# Patient Record
Sex: Female | Born: 1940 | Race: White | Hispanic: No | Marital: Single
Health system: Southern US, Community
[De-identification: ages and names within clinical notes are randomized; demographics above are authoritative.]

---

## 2004-08-09 ENCOUNTER — Ambulatory Visit: Payer: Self-pay | Admitting: Anesthesiology

## 2004-08-24 ENCOUNTER — Ambulatory Visit: Payer: Self-pay | Admitting: Anesthesiology

## 2004-09-21 ENCOUNTER — Ambulatory Visit: Payer: Self-pay | Admitting: Anesthesiology

## 2004-11-01 ENCOUNTER — Ambulatory Visit: Payer: Self-pay | Admitting: Anesthesiology

## 2004-11-02 ENCOUNTER — Ambulatory Visit: Payer: Self-pay | Admitting: Anesthesiology

## 2004-11-16 ENCOUNTER — Ambulatory Visit: Payer: Self-pay | Admitting: Anesthesiology

## 2004-12-28 ENCOUNTER — Ambulatory Visit: Payer: Self-pay | Admitting: Anesthesiology

## 2005-02-16 ENCOUNTER — Ambulatory Visit: Payer: Self-pay | Admitting: Anesthesiology

## 2005-04-05 ENCOUNTER — Ambulatory Visit: Payer: Self-pay | Admitting: Anesthesiology

## 2005-05-31 ENCOUNTER — Ambulatory Visit: Payer: Self-pay | Admitting: Anesthesiology

## 2005-07-19 ENCOUNTER — Ambulatory Visit: Payer: Self-pay | Admitting: Anesthesiology

## 2005-09-26 ENCOUNTER — Ambulatory Visit: Payer: Self-pay | Admitting: Anesthesiology

## 2005-10-24 ENCOUNTER — Ambulatory Visit: Payer: Self-pay | Admitting: Anesthesiology

## 2005-12-20 ENCOUNTER — Ambulatory Visit: Payer: Self-pay | Admitting: Anesthesiology

## 2006-02-05 ENCOUNTER — Other Ambulatory Visit: Payer: Self-pay

## 2006-02-05 ENCOUNTER — Emergency Department: Payer: Self-pay | Admitting: Emergency Medicine

## 2006-02-13 ENCOUNTER — Ambulatory Visit: Payer: Self-pay | Admitting: Anesthesiology

## 2006-04-23 ENCOUNTER — Other Ambulatory Visit: Payer: Self-pay

## 2006-04-23 ENCOUNTER — Emergency Department: Payer: Self-pay | Admitting: Emergency Medicine

## 2006-05-16 ENCOUNTER — Ambulatory Visit: Payer: Self-pay | Admitting: Anesthesiology

## 2006-05-25 ENCOUNTER — Ambulatory Visit: Payer: Self-pay | Admitting: Anesthesiology

## 2006-07-06 ENCOUNTER — Ambulatory Visit: Payer: Self-pay | Admitting: Anesthesiology

## 2006-08-24 ENCOUNTER — Ambulatory Visit: Payer: Self-pay | Admitting: Anesthesiology

## 2006-09-26 ENCOUNTER — Ambulatory Visit: Payer: Self-pay | Admitting: Anesthesiology

## 2006-11-09 ENCOUNTER — Ambulatory Visit: Payer: Self-pay | Admitting: Internal Medicine

## 2006-11-21 ENCOUNTER — Ambulatory Visit: Payer: Self-pay | Admitting: Internal Medicine

## 2006-11-22 ENCOUNTER — Ambulatory Visit: Payer: Self-pay | Admitting: Anesthesiology

## 2006-12-19 ENCOUNTER — Ambulatory Visit: Payer: Self-pay | Admitting: Anesthesiology

## 2007-01-17 ENCOUNTER — Ambulatory Visit: Payer: Self-pay | Admitting: Anesthesiology

## 2007-02-12 ENCOUNTER — Ambulatory Visit: Payer: Self-pay | Admitting: Otolaryngology

## 2007-03-21 ENCOUNTER — Ambulatory Visit: Payer: Self-pay | Admitting: Anesthesiology

## 2007-04-23 ENCOUNTER — Ambulatory Visit: Payer: Self-pay | Admitting: Anesthesiology

## 2007-05-27 ENCOUNTER — Ambulatory Visit: Payer: Self-pay | Admitting: Family Medicine

## 2007-06-14 ENCOUNTER — Ambulatory Visit: Payer: Self-pay | Admitting: Anesthesiology

## 2007-07-02 ENCOUNTER — Ambulatory Visit: Payer: Self-pay | Admitting: Family Medicine

## 2007-07-13 ENCOUNTER — Ambulatory Visit: Payer: Self-pay | Admitting: Family Medicine

## 2007-07-18 ENCOUNTER — Ambulatory Visit: Payer: Self-pay | Admitting: Anesthesiology

## 2007-09-17 ENCOUNTER — Ambulatory Visit: Payer: Self-pay | Admitting: Anesthesiology

## 2007-11-01 ENCOUNTER — Ambulatory Visit: Payer: Self-pay | Admitting: Anesthesiology

## 2007-12-10 ENCOUNTER — Ambulatory Visit: Payer: Self-pay | Admitting: Anesthesiology

## 2008-01-10 ENCOUNTER — Ambulatory Visit: Payer: Self-pay | Admitting: Anesthesiology

## 2008-02-25 ENCOUNTER — Ambulatory Visit: Payer: Self-pay | Admitting: Anesthesiology

## 2008-03-17 ENCOUNTER — Ambulatory Visit: Payer: Self-pay | Admitting: Anesthesiology

## 2008-04-28 ENCOUNTER — Ambulatory Visit: Payer: Self-pay | Admitting: Anesthesiology

## 2008-05-12 ENCOUNTER — Ambulatory Visit: Payer: Self-pay | Admitting: Anesthesiology

## 2008-05-14 ENCOUNTER — Emergency Department: Payer: Self-pay | Admitting: Emergency Medicine

## 2008-07-03 ENCOUNTER — Ambulatory Visit: Payer: Self-pay | Admitting: Anesthesiology

## 2008-07-05 ENCOUNTER — Inpatient Hospital Stay: Payer: Self-pay | Admitting: Internal Medicine

## 2008-07-14 ENCOUNTER — Inpatient Hospital Stay: Payer: Self-pay | Admitting: Internal Medicine

## 2008-08-04 ENCOUNTER — Ambulatory Visit: Payer: Self-pay | Admitting: Cardiovascular Disease

## 2008-08-18 ENCOUNTER — Ambulatory Visit: Payer: Self-pay | Admitting: Anesthesiology

## 2008-09-24 ENCOUNTER — Ambulatory Visit: Payer: Self-pay | Admitting: Anesthesiology

## 2008-10-23 ENCOUNTER — Ambulatory Visit: Payer: Self-pay | Admitting: Anesthesiology

## 2008-11-11 ENCOUNTER — Ambulatory Visit: Payer: Self-pay | Admitting: Anesthesiology

## 2008-12-02 ENCOUNTER — Ambulatory Visit: Payer: Self-pay | Admitting: Anesthesiology

## 2008-12-31 ENCOUNTER — Ambulatory Visit: Payer: Self-pay | Admitting: Anesthesiology

## 2009-02-16 ENCOUNTER — Ambulatory Visit: Payer: Self-pay | Admitting: Anesthesiology

## 2009-03-27 ENCOUNTER — Ambulatory Visit: Payer: Self-pay | Admitting: Anesthesiology

## 2009-06-15 ENCOUNTER — Ambulatory Visit: Payer: Self-pay | Admitting: Anesthesiology

## 2009-06-25 ENCOUNTER — Ambulatory Visit: Payer: Self-pay | Admitting: Anesthesiology

## 2009-07-28 ENCOUNTER — Ambulatory Visit: Payer: Self-pay | Admitting: Anesthesiology

## 2009-11-11 ENCOUNTER — Ambulatory Visit: Payer: Self-pay | Admitting: Anesthesiology

## 2009-12-17 ENCOUNTER — Ambulatory Visit: Payer: Self-pay | Admitting: Anesthesiology

## 2010-02-10 ENCOUNTER — Ambulatory Visit: Payer: Self-pay | Admitting: Anesthesiology

## 2010-05-14 ENCOUNTER — Inpatient Hospital Stay: Payer: Self-pay | Admitting: Internal Medicine

## 2010-08-03 ENCOUNTER — Ambulatory Visit: Payer: Self-pay | Admitting: Anesthesiology

## 2010-10-14 ENCOUNTER — Inpatient Hospital Stay: Payer: Self-pay | Admitting: Internal Medicine

## 2010-11-26 ENCOUNTER — Ambulatory Visit: Payer: Self-pay | Admitting: Anesthesiology

## 2010-12-09 ENCOUNTER — Emergency Department: Payer: Self-pay | Admitting: Emergency Medicine

## 2010-12-10 ENCOUNTER — Emergency Department: Payer: Self-pay | Admitting: Emergency Medicine

## 2011-02-20 ENCOUNTER — Inpatient Hospital Stay: Payer: Self-pay | Admitting: *Deleted

## 2011-02-25 ENCOUNTER — Emergency Department: Payer: Self-pay | Admitting: Emergency Medicine

## 2011-03-13 ENCOUNTER — Inpatient Hospital Stay: Payer: Self-pay | Admitting: Internal Medicine

## 2011-04-09 ENCOUNTER — Ambulatory Visit: Payer: Self-pay | Admitting: Internal Medicine

## 2011-04-18 ENCOUNTER — Inpatient Hospital Stay: Payer: Self-pay | Admitting: Internal Medicine

## 2011-05-05 ENCOUNTER — Emergency Department: Payer: Self-pay | Admitting: Unknown Physician Specialty

## 2011-05-10 ENCOUNTER — Ambulatory Visit: Payer: Self-pay | Admitting: Internal Medicine

## 2011-05-19 ENCOUNTER — Inpatient Hospital Stay: Payer: Self-pay | Admitting: Internal Medicine

## 2011-05-19 LAB — CBC WITH DIFFERENTIAL/PLATELET
Basophil #: 0 10*3/uL (ref 0.0–0.1)
HGB: 10.2 g/dL — ABNORMAL LOW (ref 12.0–16.0)
Lymphocyte #: 2.4 10*3/uL (ref 1.0–3.6)
MCH: 32.1 pg (ref 26.0–34.0)
MCV: 96 fL (ref 80–100)
Monocyte %: 7.3 %
Neutrophil %: 64.7 %
Platelet: 279 10*3/uL (ref 150–440)
RDW: 14.8 % — ABNORMAL HIGH (ref 11.5–14.5)
WBC: 8.7 10*3/uL (ref 3.6–11.0)

## 2011-05-19 LAB — COMPREHENSIVE METABOLIC PANEL
Albumin: 2.9 g/dL — ABNORMAL LOW (ref 3.4–5.0)
Alkaline Phosphatase: 65 U/L (ref 50–136)
Bilirubin,Total: 0.8 mg/dL (ref 0.2–1.0)
Calcium, Total: 8.8 mg/dL (ref 8.5–10.1)
Chloride: 99 mmol/L (ref 98–107)
Creatinine: 0.61 mg/dL (ref 0.60–1.30)
EGFR (Non-African Amer.): >60
Glucose: 87 mg/dL (ref 65–99)
Osmolality: 275 (ref 275–301)
SGPT (ALT): 43 U/L
Sodium: 139 mmol/L (ref 136–145)
Total Protein: 6.3 g/dL — ABNORMAL LOW (ref 6.4–8.2)

## 2011-05-19 LAB — RAPID INFLUENZA A&B ANTIGENS

## 2011-05-19 LAB — URINALYSIS, COMPLETE
Bacteria: NONE SEEN
Bilirubin,UR: NEGATIVE
Blood: NEGATIVE
Leukocyte Esterase: NEGATIVE
Nitrite: NEGATIVE
Protein: 30
Specific Gravity: 1.012 (ref 1.003–1.030)

## 2011-05-19 LAB — PROTIME-INR: Prothrombin Time: 12.9 secs (ref 11.5–14.7)

## 2011-05-19 LAB — CK TOTAL AND CKMB (NOT AT ARMC)
CK, Total: 147 U/L (ref 21–215)
CK-MB: 0.6 ng/mL (ref 0.5–3.6)

## 2011-05-20 LAB — CBC WITH DIFFERENTIAL/PLATELET
Basophil %: 0.2 %
Eosinophil #: 0 10*3/uL (ref 0.0–0.7)
HCT: 31.9 % — ABNORMAL LOW (ref 35.0–47.0)
HGB: 10.8 g/dL — ABNORMAL LOW (ref 12.0–16.0)
Lymphocyte #: 0.7 10*3/uL — ABNORMAL LOW (ref 1.0–3.6)
MCH: 32.2 pg (ref 26.0–34.0)
MCHC: 33.8 g/dL (ref 32.0–36.0)
Monocyte #: 0.2 10*3/uL (ref 0.0–0.7)
Neutrophil #: 9.3 10*3/uL — ABNORMAL HIGH (ref 1.4–6.5)
Neutrophil %: 90.9 %
RDW: 14.5 % (ref 11.5–14.5)

## 2011-05-20 LAB — BASIC METABOLIC PANEL
Anion Gap: 11 (ref 7–16)
BUN: 14 mg/dL (ref 7–18)
Chloride: 102 mmol/L (ref 98–107)
Co2: 29 mmol/L (ref 21–32)
Creatinine: 0.69 mg/dL (ref 0.60–1.30)
Osmolality: 287 (ref 275–301)
Potassium: 4.1 mmol/L (ref 3.5–5.1)
Sodium: 142 mmol/L (ref 136–145)

## 2011-05-21 LAB — URINE CULTURE

## 2011-05-25 LAB — EXPECTORATED SPUTUM ASSESSMENT W REFEX TO RESP CULTURE

## 2011-05-25 LAB — CULTURE, BLOOD (SINGLE)

## 2011-06-10 ENCOUNTER — Ambulatory Visit: Payer: Self-pay | Admitting: Internal Medicine

## 2011-06-23 LAB — URINALYSIS, COMPLETE
Glucose,UR: NEGATIVE mg/dL (ref 0–75)
Hyaline Cast: 1
Leukocyte Esterase: NEGATIVE
Nitrite: NEGATIVE
Protein: 30
RBC,UR: <1 /HPF (ref 0–5)
Specific Gravity: 1.012 (ref 1.003–1.030)
WBC UR: 1 /HPF (ref 0–5)

## 2011-06-23 LAB — COMPREHENSIVE METABOLIC PANEL
Alkaline Phosphatase: 64 U/L (ref 50–136)
Anion Gap: 13 (ref 7–16)
BUN: 10 mg/dL (ref 7–18)
Bilirubin,Total: 0.7 mg/dL (ref 0.2–1.0)
Chloride: 102 mmol/L (ref 98–107)
Creatinine: 0.92 mg/dL (ref 0.60–1.30)
EGFR (African American): >60
Potassium: 4.5 mmol/L (ref 3.5–5.1)
SGPT (ALT): 85 U/L — ABNORMAL HIGH
Sodium: 140 mmol/L (ref 136–145)
Total Protein: 6.8 g/dL (ref 6.4–8.2)

## 2011-06-23 LAB — DRUG SCREEN, URINE
Barbiturates, Ur Screen: NEGATIVE (ref ?–200)
Cannabinoid 50 Ng, Ur ~~LOC~~: NEGATIVE (ref ?–50)
Cocaine Metabolite,Ur ~~LOC~~: NEGATIVE (ref ?–300)
MDMA (Ecstasy)Ur Screen: POSITIVE (ref ?–500)
Methadone, Ur Screen: NEGATIVE (ref ?–300)
Opiate, Ur Screen: POSITIVE (ref ?–300)
Phencyclidine (PCP) Ur S: NEGATIVE (ref ?–25)

## 2011-06-23 LAB — CBC
HCT: 32.6 % — ABNORMAL LOW (ref 35.0–47.0)
HGB: 10.9 g/dL — ABNORMAL LOW (ref 12.0–16.0)
MCHC: 33.6 g/dL (ref 32.0–36.0)
MCV: 94 fL (ref 80–100)
RBC: 3.48 10*6/uL — ABNORMAL LOW (ref 3.80–5.20)
WBC: 9.9 10*3/uL (ref 3.6–11.0)

## 2011-06-23 LAB — ETHANOL
Ethanol %: <0.003 % (ref 0.000–0.080)
Ethanol: <3 mg/dL

## 2011-06-23 LAB — TROPONIN I: Troponin-I: <0.02 ng/mL

## 2011-06-24 LAB — FOLATE: Folic Acid: 11.2 ng/mL (ref 3.1–100.0)

## 2011-06-25 ENCOUNTER — Inpatient Hospital Stay: Payer: Self-pay | Admitting: Internal Medicine

## 2011-06-25 LAB — BASIC METABOLIC PANEL
Anion Gap: 11 (ref 7–16)
Calcium, Total: 8.2 mg/dL — ABNORMAL LOW (ref 8.5–10.1)
Co2: 29 mmol/L (ref 21–32)
EGFR (African American): >60
EGFR (Non-African Amer.): >60
Glucose: 129 mg/dL — ABNORMAL HIGH (ref 65–99)
Osmolality: 283 (ref 275–301)

## 2011-06-25 LAB — CBC WITH DIFFERENTIAL/PLATELET
Basophil %: 0.2 %
Eosinophil %: 0.3 %
HGB: 9.5 g/dL — ABNORMAL LOW (ref 12.0–16.0)
Lymphocyte #: 2.3 10*3/uL (ref 1.0–3.6)
MCH: 31.2 pg (ref 26.0–34.0)
MCV: 94 fL (ref 80–100)
Monocyte %: 6.2 %
Neutrophil #: 5 10*3/uL (ref 1.4–6.5)
Platelet: 183 10*3/uL (ref 150–440)
RBC: 3.05 10*6/uL — ABNORMAL LOW (ref 3.80–5.20)
RDW: 16.9 % — ABNORMAL HIGH (ref 11.5–14.5)
WBC: 7.8 10*3/uL (ref 3.6–11.0)

## 2011-07-04 LAB — BASIC METABOLIC PANEL
Anion Gap: 14 (ref 7–16)
BUN: 11 mg/dL (ref 7–18)
Chloride: 96 mmol/L — ABNORMAL LOW (ref 98–107)
EGFR (African American): >60
EGFR (Non-African Amer.): >60
Osmolality: 276 (ref 275–301)
Sodium: 139 mmol/L (ref 136–145)

## 2011-07-04 LAB — CBC WITH DIFFERENTIAL/PLATELET
Basophil %: 0.2 %
Eosinophil #: 0 10*3/uL (ref 0.0–0.7)
HCT: 31.8 % — ABNORMAL LOW (ref 35.0–47.0)
Lymphocyte #: 2.4 10*3/uL (ref 1.0–3.6)
Monocyte #: 0.6 10*3/uL (ref 0.0–0.7)
Neutrophil #: 5.5 10*3/uL (ref 1.4–6.5)
Neutrophil %: 63.7 %
Platelet: 396 10*3/uL (ref 150–440)
RBC: 3.4 10*6/uL — ABNORMAL LOW (ref 3.80–5.20)
RDW: 17.1 % — ABNORMAL HIGH (ref 11.5–14.5)
WBC: 8.6 10*3/uL (ref 3.6–11.0)

## 2011-07-04 LAB — CK TOTAL AND CKMB (NOT AT ARMC)
CK, Total: 75 U/L (ref 21–215)
CK-MB: 0.9 ng/mL (ref 0.5–3.6)

## 2011-07-05 ENCOUNTER — Inpatient Hospital Stay: Payer: Self-pay | Admitting: Internal Medicine

## 2011-07-05 LAB — URINALYSIS, COMPLETE
Bacteria: NONE SEEN
Blood: NEGATIVE
Granular Cast: 1
Hyaline Cast: 3
Leukocyte Esterase: NEGATIVE
Ph: 6 (ref 4.5–8.0)
Squamous Epithelial: <1
WBC UR: 2 /HPF (ref 0–5)
Waxy Cast: 1

## 2011-07-05 LAB — GASTROCCULT (ARMC)
Occult Blood, Gastric: NEGATIVE
Ph, Gastric: 3 (ref 1–3)

## 2011-07-06 DIAGNOSIS — R000 Tachycardia, unspecified: Secondary | ICD-10-CM

## 2011-07-06 LAB — BASIC METABOLIC PANEL
Anion Gap: 9 (ref 7–16)
Calcium, Total: 8.3 mg/dL — ABNORMAL LOW (ref 8.5–10.1)
Chloride: 92 mmol/L — ABNORMAL LOW (ref 98–107)
Co2: 36 mmol/L — ABNORMAL HIGH (ref 21–32)
Creatinine: 1.36 mg/dL — ABNORMAL HIGH (ref 0.60–1.30)
EGFR (African American): 50 — ABNORMAL LOW
EGFR (Non-African Amer.): 41 — ABNORMAL LOW
Glucose: 140 mg/dL — ABNORMAL HIGH (ref 65–99)

## 2011-07-06 LAB — CK TOTAL AND CKMB (NOT AT ARMC)
CK, Total: 40 U/L (ref 21–215)
CK-MB: <0.5 ng/mL — ABNORMAL LOW (ref 0.5–3.6)

## 2011-07-07 LAB — CBC WITH DIFFERENTIAL/PLATELET
Basophil #: 0 10*3/uL (ref 0.0–0.1)
Basophil %: 0.1 %
Eosinophil #: 0 10*3/uL (ref 0.0–0.7)
Lymphocyte %: 7 %
MCHC: 32.7 g/dL (ref 32.0–36.0)
Monocyte %: 4.8 %
Neutrophil %: 88.1 %
Platelet: 336 10*3/uL (ref 150–440)
RDW: 16.5 % — ABNORMAL HIGH (ref 11.5–14.5)
WBC: 7.5 10*3/uL (ref 3.6–11.0)

## 2011-07-07 LAB — CREATININE, SERUM
Creatinine: 1.57 mg/dL — ABNORMAL HIGH (ref 0.60–1.30)
EGFR (African American): 42 — ABNORMAL LOW

## 2011-07-07 LAB — BASIC METABOLIC PANEL
Anion Gap: 12 (ref 7–16)
BUN: 16 mg/dL (ref 7–18)
Calcium, Total: 8.1 mg/dL — ABNORMAL LOW (ref 8.5–10.1)
EGFR (African American): 43 — ABNORMAL LOW
EGFR (Non-African Amer.): 35 — ABNORMAL LOW
Glucose: 148 mg/dL — ABNORMAL HIGH (ref 65–99)
Osmolality: 287 (ref 275–301)

## 2011-07-07 LAB — RETICULOCYTES
Absolute Retic Count: 0.101 10*6/uL — ABNORMAL HIGH (ref 0.024–0.084)
Reticulocyte: 3.7 % — ABNORMAL HIGH (ref 0.5–1.5)

## 2011-07-07 LAB — URINE CULTURE

## 2011-07-07 LAB — FERRITIN: Ferritin (ARMC): 377 ng/mL (ref 8–388)

## 2011-07-07 LAB — IRON AND TIBC
Iron Bind.Cap.(Total): 150 ug/dL — ABNORMAL LOW (ref 250–450)
Iron: 53 ug/dL (ref 50–170)
Unbound Iron-Bind.Cap.: 97 ug/dL

## 2011-07-07 LAB — HEMOGLOBIN: HGB: 8.5 g/dL — ABNORMAL LOW (ref 12.0–16.0)

## 2011-07-08 ENCOUNTER — Ambulatory Visit: Payer: Self-pay | Admitting: Internal Medicine

## 2011-07-08 LAB — CBC WITH DIFFERENTIAL/PLATELET
Basophil %: 0.2 %
Eosinophil %: 0 %
HCT: 25.1 % — ABNORMAL LOW (ref 35.0–47.0)
HGB: 8.2 g/dL — ABNORMAL LOW (ref 12.0–16.0)
Lymphocyte %: 9.2 %
MCH: 30.2 pg (ref 26.0–34.0)
MCHC: 32.7 g/dL (ref 32.0–36.0)
MCV: 93 fL (ref 80–100)
Monocyte #: 0.3 10*3/uL (ref 0.0–0.7)
Monocyte %: 3.9 %
Neutrophil #: 6.6 10*3/uL — ABNORMAL HIGH (ref 1.4–6.5)
Neutrophil %: 86.7 %
Platelet: 300 10*3/uL (ref 150–440)
WBC: 7.7 10*3/uL (ref 3.6–11.0)

## 2011-07-08 LAB — BASIC METABOLIC PANEL
Chloride: 99 mmol/L (ref 98–107)
Co2: 32 mmol/L (ref 21–32)
EGFR (African American): 43 — ABNORMAL LOW
Glucose: 117 mg/dL — ABNORMAL HIGH (ref 65–99)
Osmolality: 279 (ref 275–301)
Sodium: 138 mmol/L (ref 136–145)

## 2011-07-09 LAB — BASIC METABOLIC PANEL
Anion Gap: 10 (ref 7–16)
BUN: 22 mg/dL — ABNORMAL HIGH (ref 7–18)
Chloride: 99 mmol/L (ref 98–107)
Co2: 32 mmol/L (ref 21–32)
Creatinine: 1.52 mg/dL — ABNORMAL HIGH (ref 0.60–1.30)
EGFR (African American): 44 — ABNORMAL LOW
Osmolality: 285 (ref 275–301)
Sodium: 141 mmol/L (ref 136–145)

## 2011-07-09 LAB — HEMOGLOBIN: HGB: 8.5 g/dL — ABNORMAL LOW (ref 12.0–16.0)

## 2011-07-10 LAB — CULTURE, BLOOD (SINGLE)

## 2011-07-11 LAB — CBC WITH DIFFERENTIAL/PLATELET
Basophil #: 0 10*3/uL (ref 0.0–0.1)
Eosinophil %: 0 %
Lymphocyte #: 1.1 10*3/uL (ref 1.0–3.6)
MCH: 30.5 pg (ref 26.0–34.0)
MCV: 93 fL (ref 80–100)
Monocyte #: 0.5 10*3/uL (ref 0.0–0.7)
Monocyte %: 5.2 %
Neutrophil %: 83.7 %
Platelet: 306 10*3/uL (ref 150–440)
RDW: 16.3 % — ABNORMAL HIGH (ref 11.5–14.5)
WBC: 9.7 10*3/uL (ref 3.6–11.0)

## 2011-07-11 LAB — RENAL FUNCTION PANEL
BUN: 21 mg/dL — ABNORMAL HIGH (ref 7–18)
Chloride: 102 mmol/L (ref 98–107)
Creatinine: 1.54 mg/dL — ABNORMAL HIGH (ref 0.60–1.30)
EGFR (African American): 43 — ABNORMAL LOW
EGFR (Non-African Amer.): 36 — ABNORMAL LOW
Glucose: 137 mg/dL — ABNORMAL HIGH (ref 65–99)
Phosphorus: 3 mg/dL (ref 2.5–4.9)
Potassium: 4 mmol/L (ref 3.5–5.1)

## 2011-07-12 LAB — WBCS, STOOL

## 2011-07-12 LAB — PROTEIN ELECTROPHORESIS(ARMC)

## 2011-07-13 LAB — BASIC METABOLIC PANEL
Anion Gap: 9 (ref 7–16)
Calcium, Total: 8.3 mg/dL — ABNORMAL LOW (ref 8.5–10.1)
Co2: 30 mmol/L (ref 21–32)
Creatinine: 1.48 mg/dL — ABNORMAL HIGH (ref 0.60–1.30)
EGFR (African American): 45 — ABNORMAL LOW
EGFR (Non-African Amer.): 37 — ABNORMAL LOW
Glucose: 104 mg/dL — ABNORMAL HIGH (ref 65–99)
Osmolality: 283 (ref 275–301)
Sodium: 141 mmol/L (ref 136–145)

## 2011-08-19 ENCOUNTER — Emergency Department: Payer: Self-pay | Admitting: Emergency Medicine

## 2011-08-19 LAB — COMPREHENSIVE METABOLIC PANEL
Albumin: 3.2 g/dL — ABNORMAL LOW (ref 3.4–5.0)
Anion Gap: 8 (ref 7–16)
BUN: 15 mg/dL (ref 7–18)
Calcium, Total: 8.7 mg/dL (ref 8.5–10.1)
Chloride: 102 mmol/L (ref 98–107)
Co2: 30 mmol/L (ref 21–32)
Creatinine: 1.01 mg/dL (ref 0.60–1.30)
EGFR (Non-African Amer.): 57 — ABNORMAL LOW
SGOT(AST): 23 U/L (ref 15–37)
Sodium: 140 mmol/L (ref 136–145)
Total Protein: 5.9 g/dL — ABNORMAL LOW (ref 6.4–8.2)

## 2011-08-19 LAB — CBC
HCT: 26.9 % — ABNORMAL LOW (ref 35.0–47.0)
Platelet: 189 10*3/uL (ref 150–440)
RBC: 2.84 10*6/uL — ABNORMAL LOW (ref 3.80–5.20)
RDW: 16.9 % — ABNORMAL HIGH (ref 11.5–14.5)
WBC: 9.2 10*3/uL (ref 3.6–11.0)

## 2011-08-19 LAB — URINALYSIS, COMPLETE
Bacteria: NONE SEEN
Blood: NEGATIVE
Glucose,UR: NEGATIVE mg/dL (ref 0–75)
Ketone: NEGATIVE
Leukocyte Esterase: NEGATIVE
Nitrite: NEGATIVE
Ph: 6 (ref 4.5–8.0)
Squamous Epithelial: <1
WBC UR: 1 /HPF (ref 0–5)

## 2011-08-21 LAB — URINE CULTURE

## 2011-08-25 LAB — CULTURE, BLOOD (SINGLE)

## 2011-10-29 ENCOUNTER — Emergency Department: Payer: Self-pay | Admitting: Emergency Medicine

## 2011-10-29 LAB — BASIC METABOLIC PANEL
BUN: 16 mg/dL (ref 7–18)
Calcium, Total: 9.2 mg/dL (ref 8.5–10.1)
Chloride: 106 mmol/L (ref 98–107)
Co2: 29 mmol/L (ref 21–32)
Creatinine: 1.01 mg/dL (ref 0.60–1.30)
EGFR (African American): >60
EGFR (Non-African Amer.): 57 — ABNORMAL LOW
Glucose: 102 mg/dL — ABNORMAL HIGH (ref 65–99)
Osmolality: 285 (ref 275–301)
Potassium: 4.3 mmol/L (ref 3.5–5.1)

## 2011-10-29 LAB — URINALYSIS, COMPLETE
Blood: NEGATIVE
Ketone: NEGATIVE
Leukocyte Esterase: NEGATIVE
Nitrite: NEGATIVE
Ph: 5 (ref 4.5–8.0)
RBC,UR: NONE SEEN /HPF (ref 0–5)
Specific Gravity: 1.011 (ref 1.003–1.030)

## 2011-10-29 LAB — CK TOTAL AND CKMB (NOT AT ARMC): CK-MB: 0.7 ng/mL (ref 0.5–3.6)

## 2011-10-29 LAB — CBC
HCT: 32.7 % — ABNORMAL LOW (ref 35.0–47.0)
MCHC: 32.4 g/dL (ref 32.0–36.0)
MCV: 92 fL (ref 80–100)

## 2011-10-29 LAB — TROPONIN I: Troponin-I: <0.02 ng/mL

## 2011-10-29 LAB — PRO B NATRIURETIC PEPTIDE: B-Type Natriuretic Peptide: 91 pg/mL (ref 0–125)

## 2012-08-18 ENCOUNTER — Emergency Department: Payer: Self-pay | Admitting: Emergency Medicine

## 2012-09-29 ENCOUNTER — Emergency Department: Payer: Self-pay | Admitting: Emergency Medicine

## 2013-01-30 IMAGING — CR DG CHEST 2V
1 series · 3 of 3 positions shown · non-contrast
Comparison: none

REASON FOR EXAM: shortness of breath and fever
COMMENTS:

PROCEDURE:     DXR - DXR CHEST PA (OR AP) AND LATERAL  - May 19, 2011  [DATE]
RESULT:     Comparison is made to the study 05 May, 2011.
The lungs are hyperinflated but appear to be clear. The heart and pulmonary
vessels are normal. The bony and mediastinal structures are unremarkable.

[Series 1: ap · 0.17mm/px · 3 of 3 slices shown]
[im 1/3]
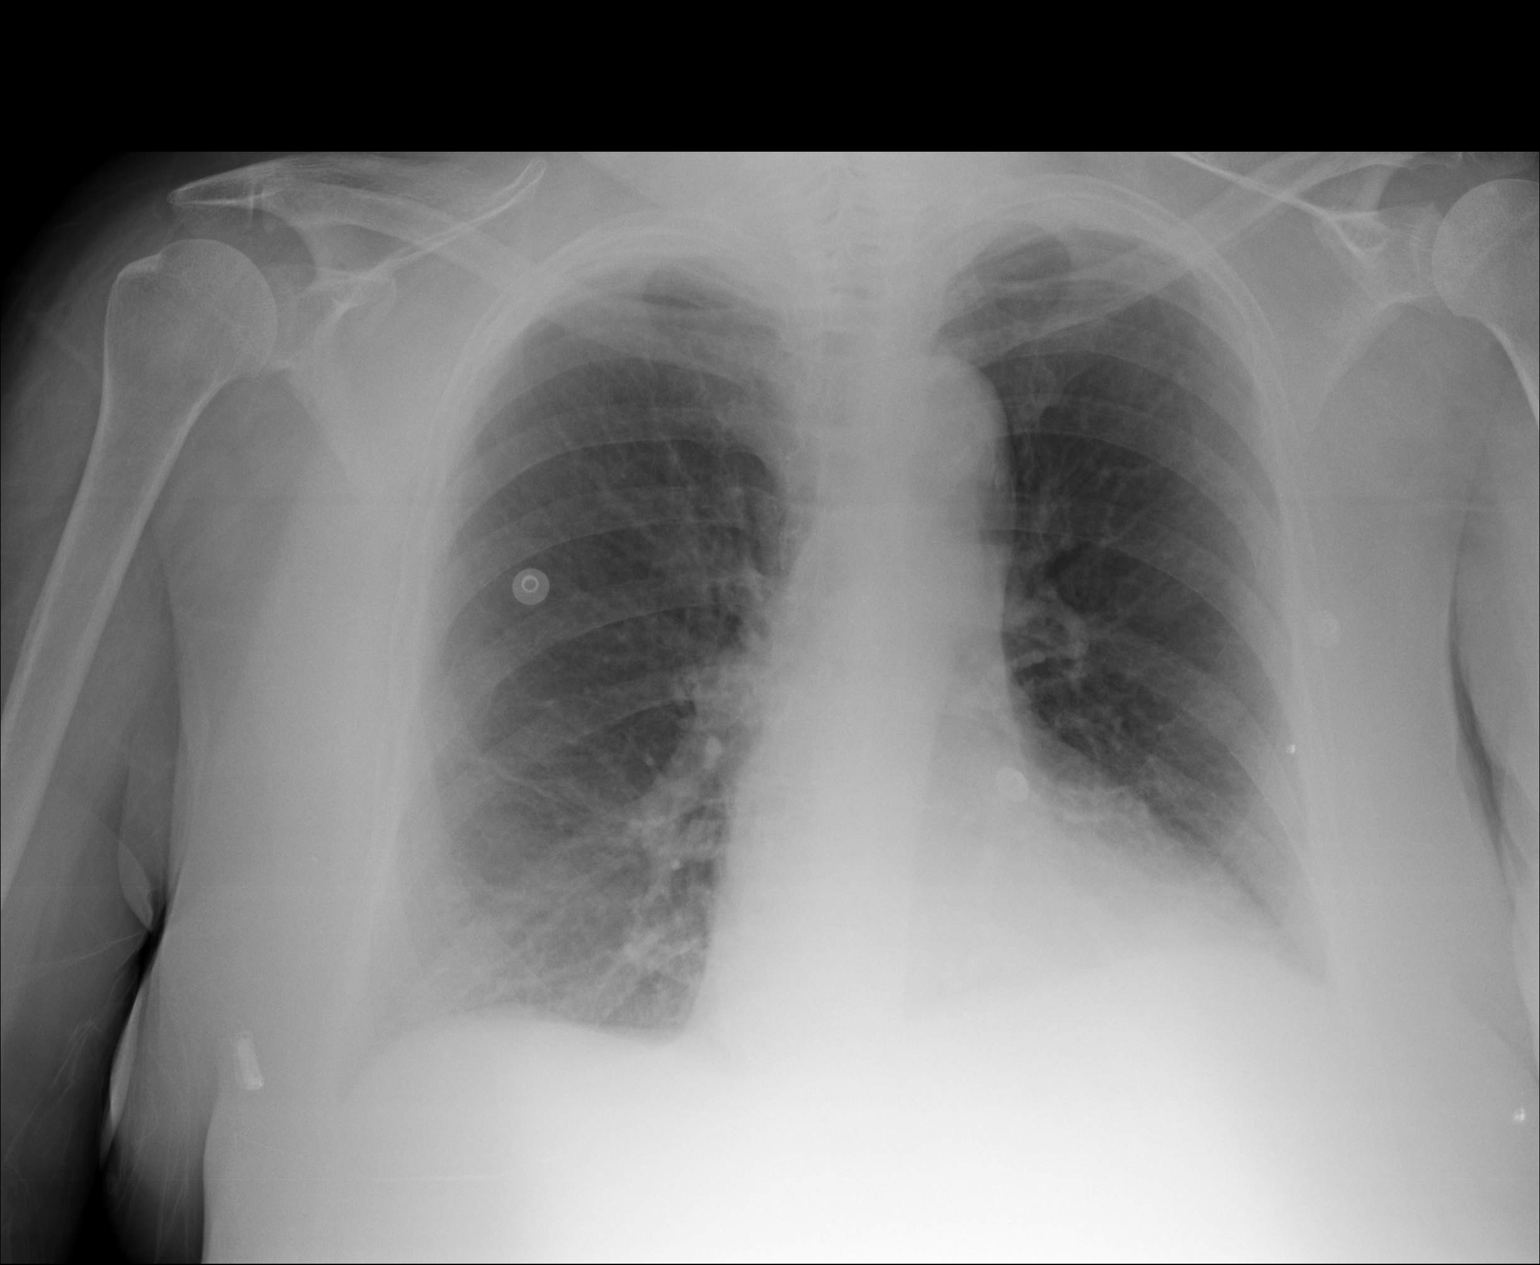
[im 2/3]
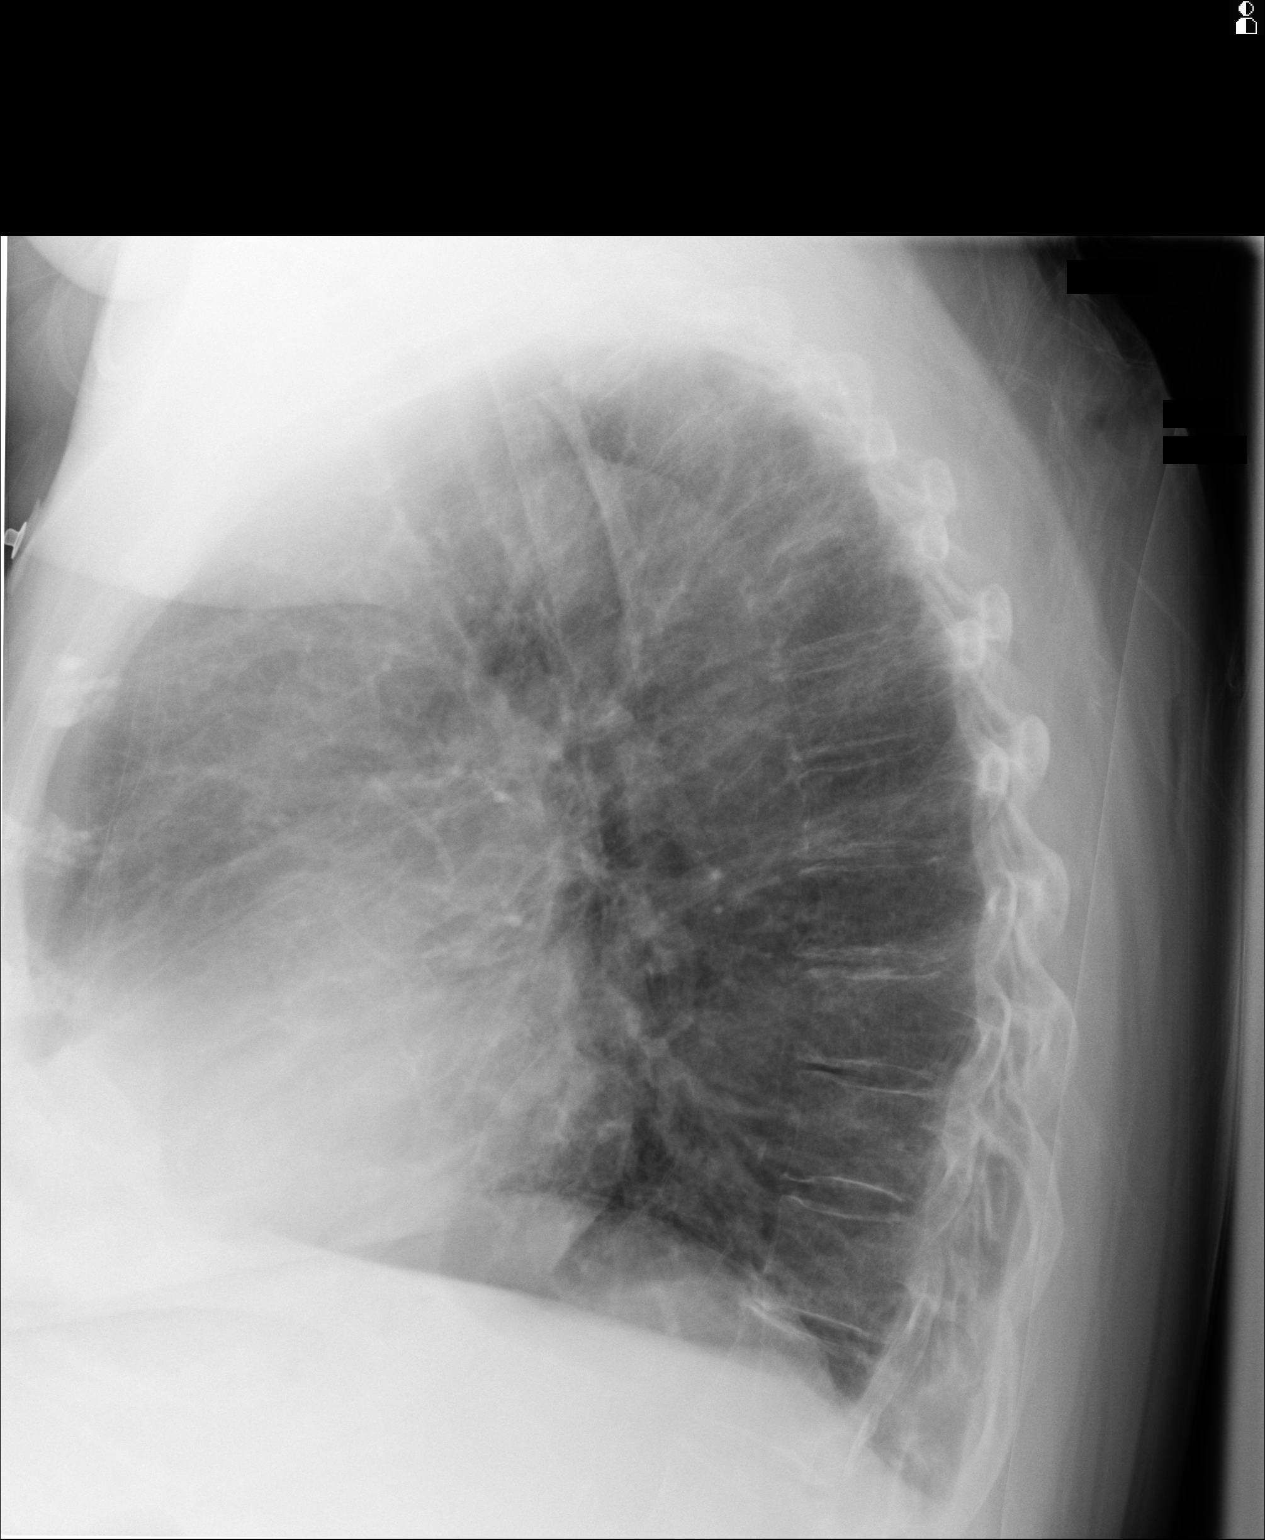
[im 3/3]
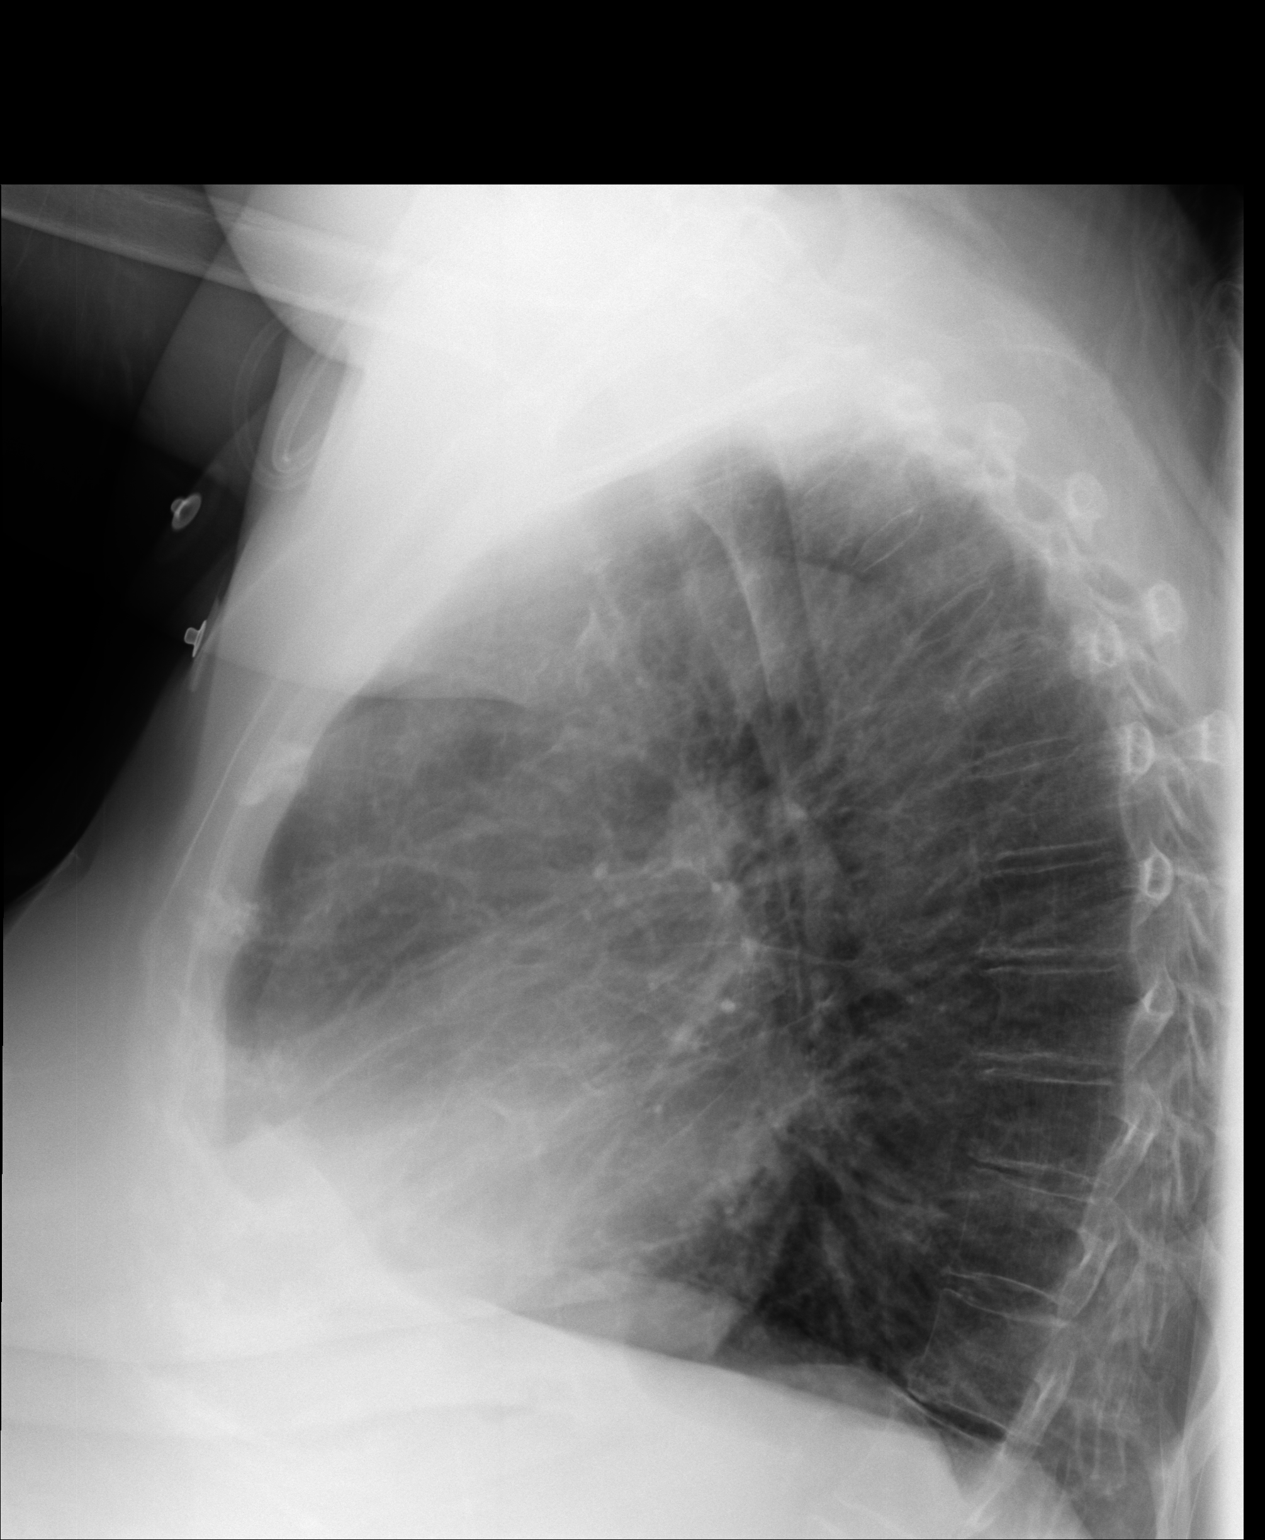

[3 of 3 positions shown; findings below may reference images not displayed]

IMPRESSION: 1. COPD.
2. No acute cardiopulmonary disease evident.
3. The cardiac silhouette is at the upper limits of normal in size.

## 2013-03-06 IMAGING — CR DG CHEST 1V PORT
1 series · 1 of 1 positions shown · non-contrast
Comparison: none

REASON FOR EXAM: altered mental status
COMMENTS:

[x chest ap]
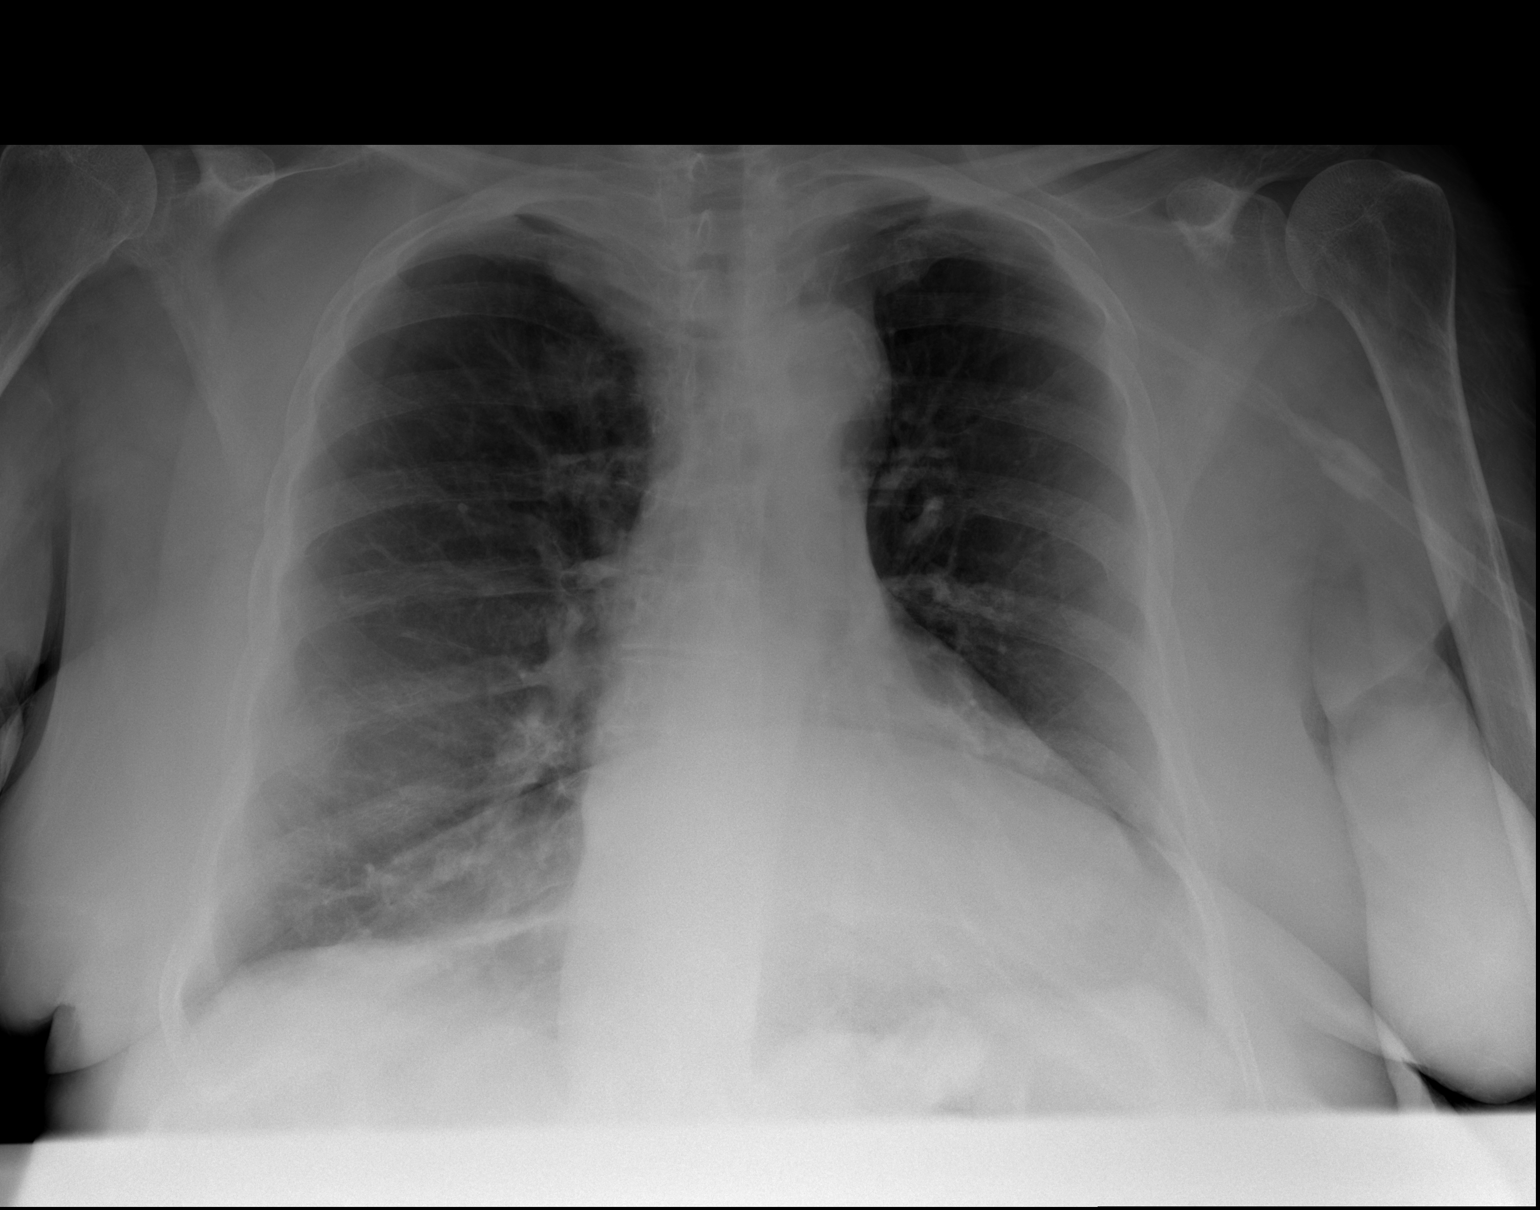

[1 of 1 positions shown; findings below may reference images not displayed]

PROCEDURE:     DXR - DXR PORTABLE CHEST SINGLE VIEW  - June 23, 2011  [DATE]

RESULT:

Frontal view of the chest is performed. Comparison is made to a prior study
dated 05/19/2011.

Area of linear increased density projects in the region of the lingula. The
cardiac silhouette is moderately enlarged. The visualized bony skeleton is
unremarkable.
IMPRESSION: Discoid atelectasis versus possible infiltrate in the region
of the lingula.

## 2013-03-07 IMAGING — CR SACRUM AND COCCYX - 2+ VIEW
1 series · 3 of 3 positions shown · non-contrast
Comparison: none

REASON FOR EXAM: pain s/p fall
COMMENTS:

PROCEDURE:     DXR - DXR SACRUM AND COCCYX  - June 24, 2011  [DATE]
RESULT:     Three views of the sacrum reveal the bones to be osteopenic. I
do not see objective evidence of an acute fracture. There are at least 3
sacral struts visible.

[Series 2: t sacrum ap · 0.14mm/px · 3 of 3 slices shown]
[im 1/3]
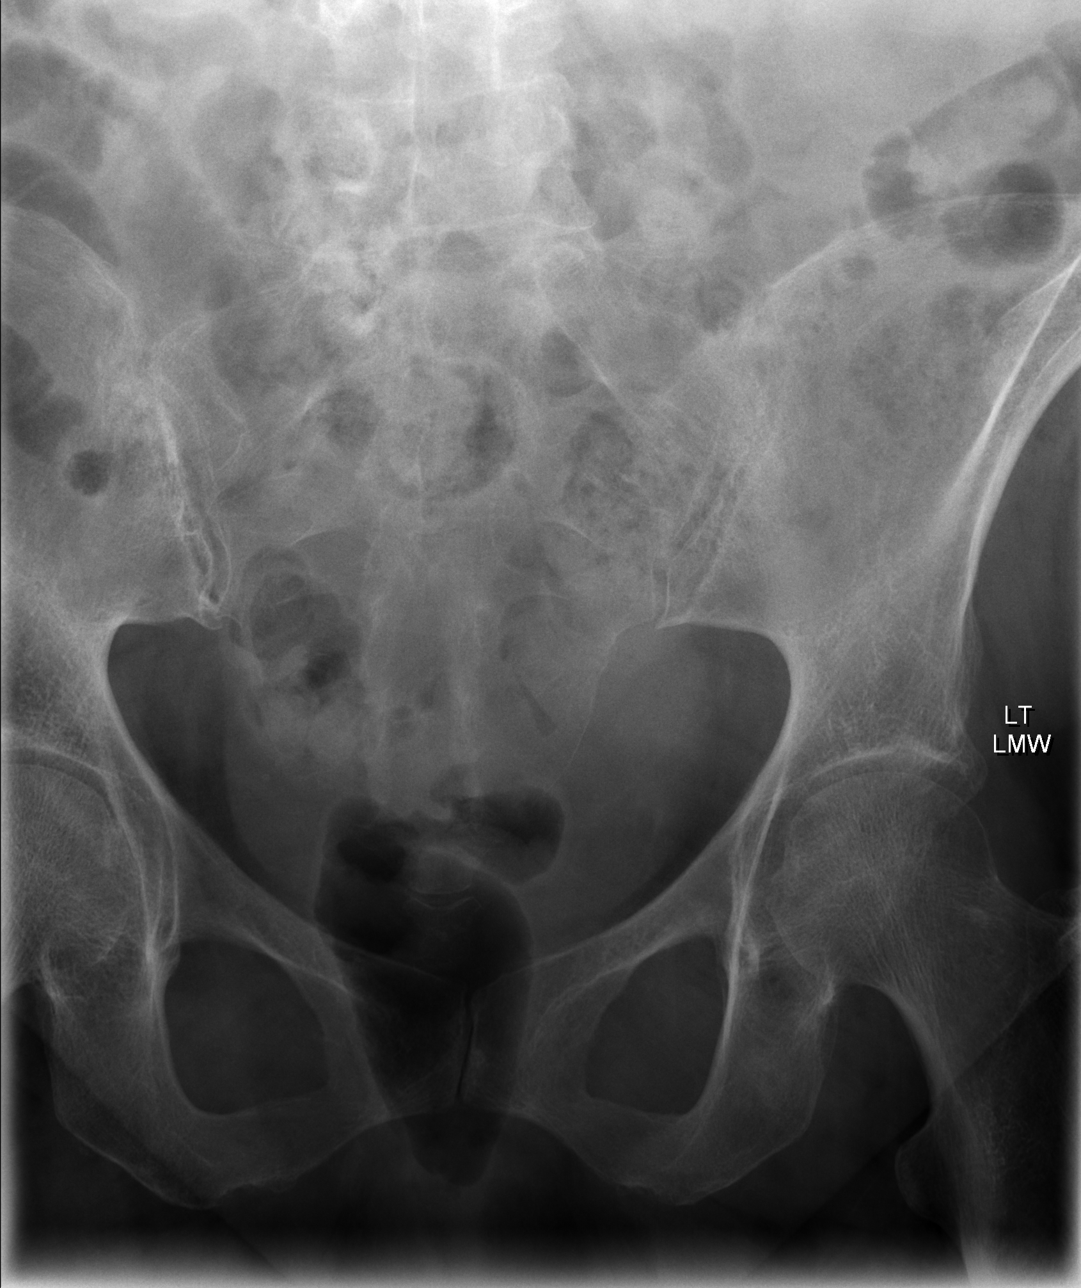
[im 2/3]
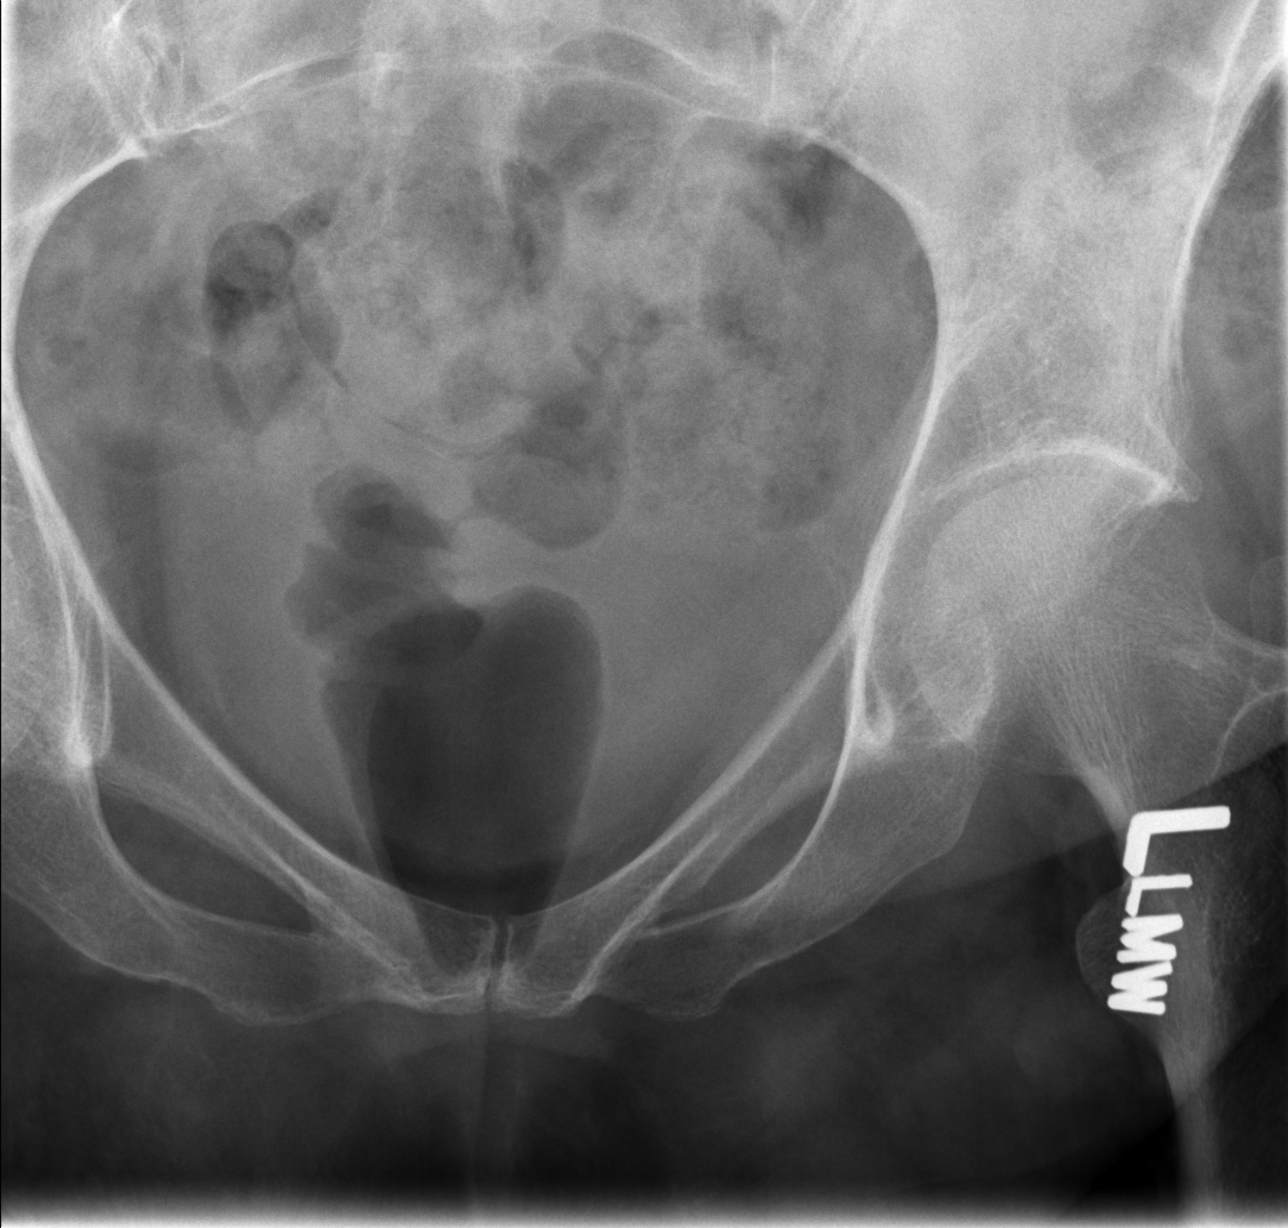
[im 3/3]
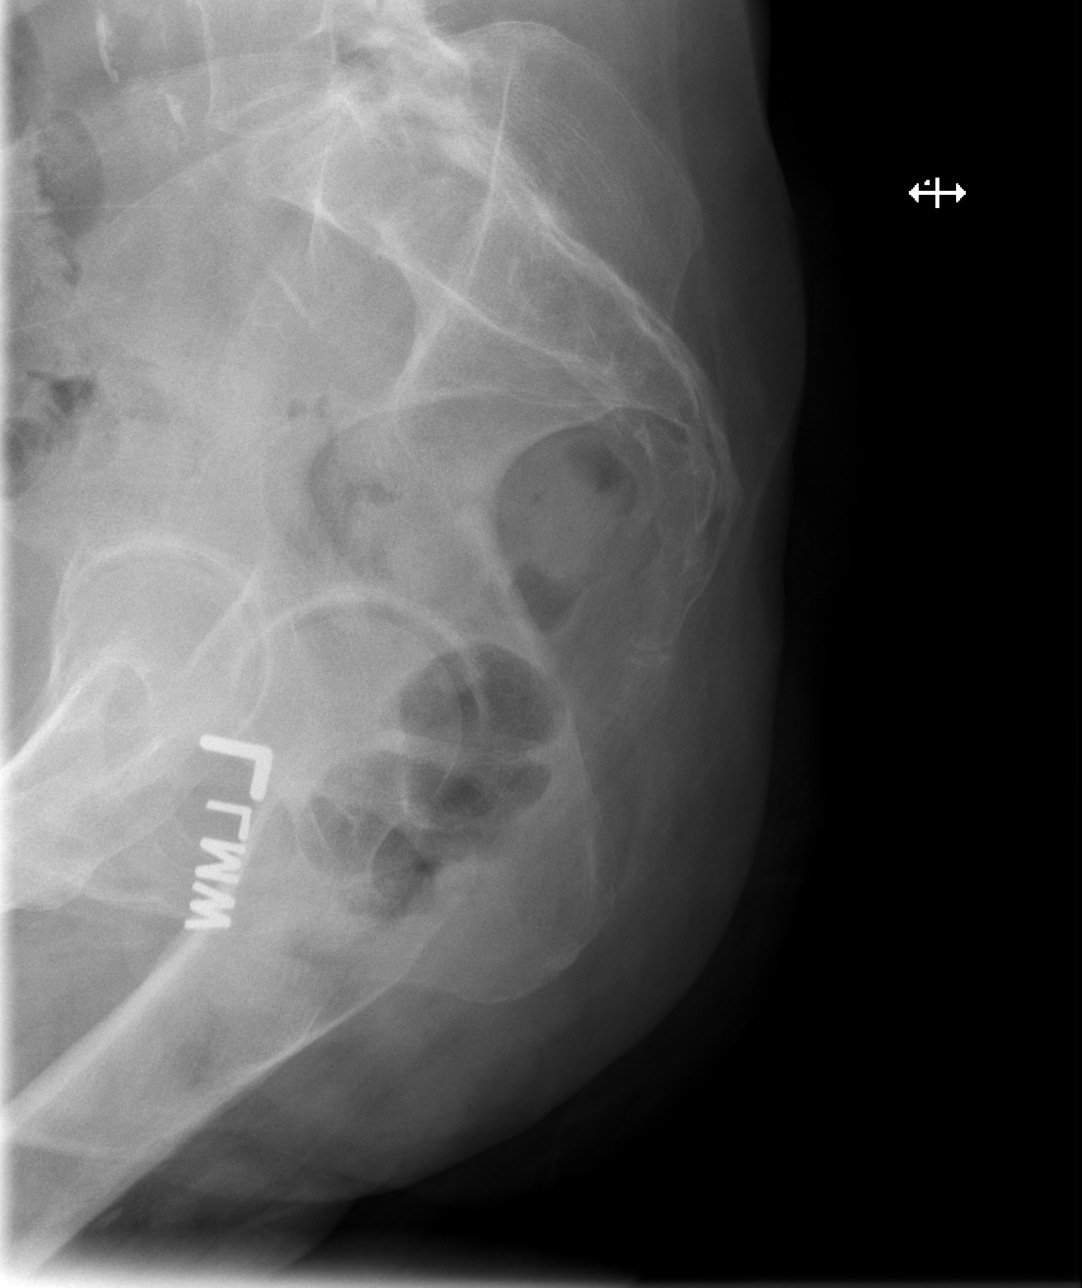

[3 of 3 positions shown; findings below may reference images not displayed]

IMPRESSION: I see no acute bony abnormality of the sacrum. Given the
patient's history of previous trauma, it may be useful to consider her for a
limited nuclear bone scan or for MRI if there are strong clinical concerns
of an occult sacral fracture.

## 2013-03-17 IMAGING — CR DG CHEST 1V PORT
1 series · 1 of 1 positions shown · non-contrast
Comparison: none

REASON FOR EXAM: ams and cough
COMMENTS:

[portable]
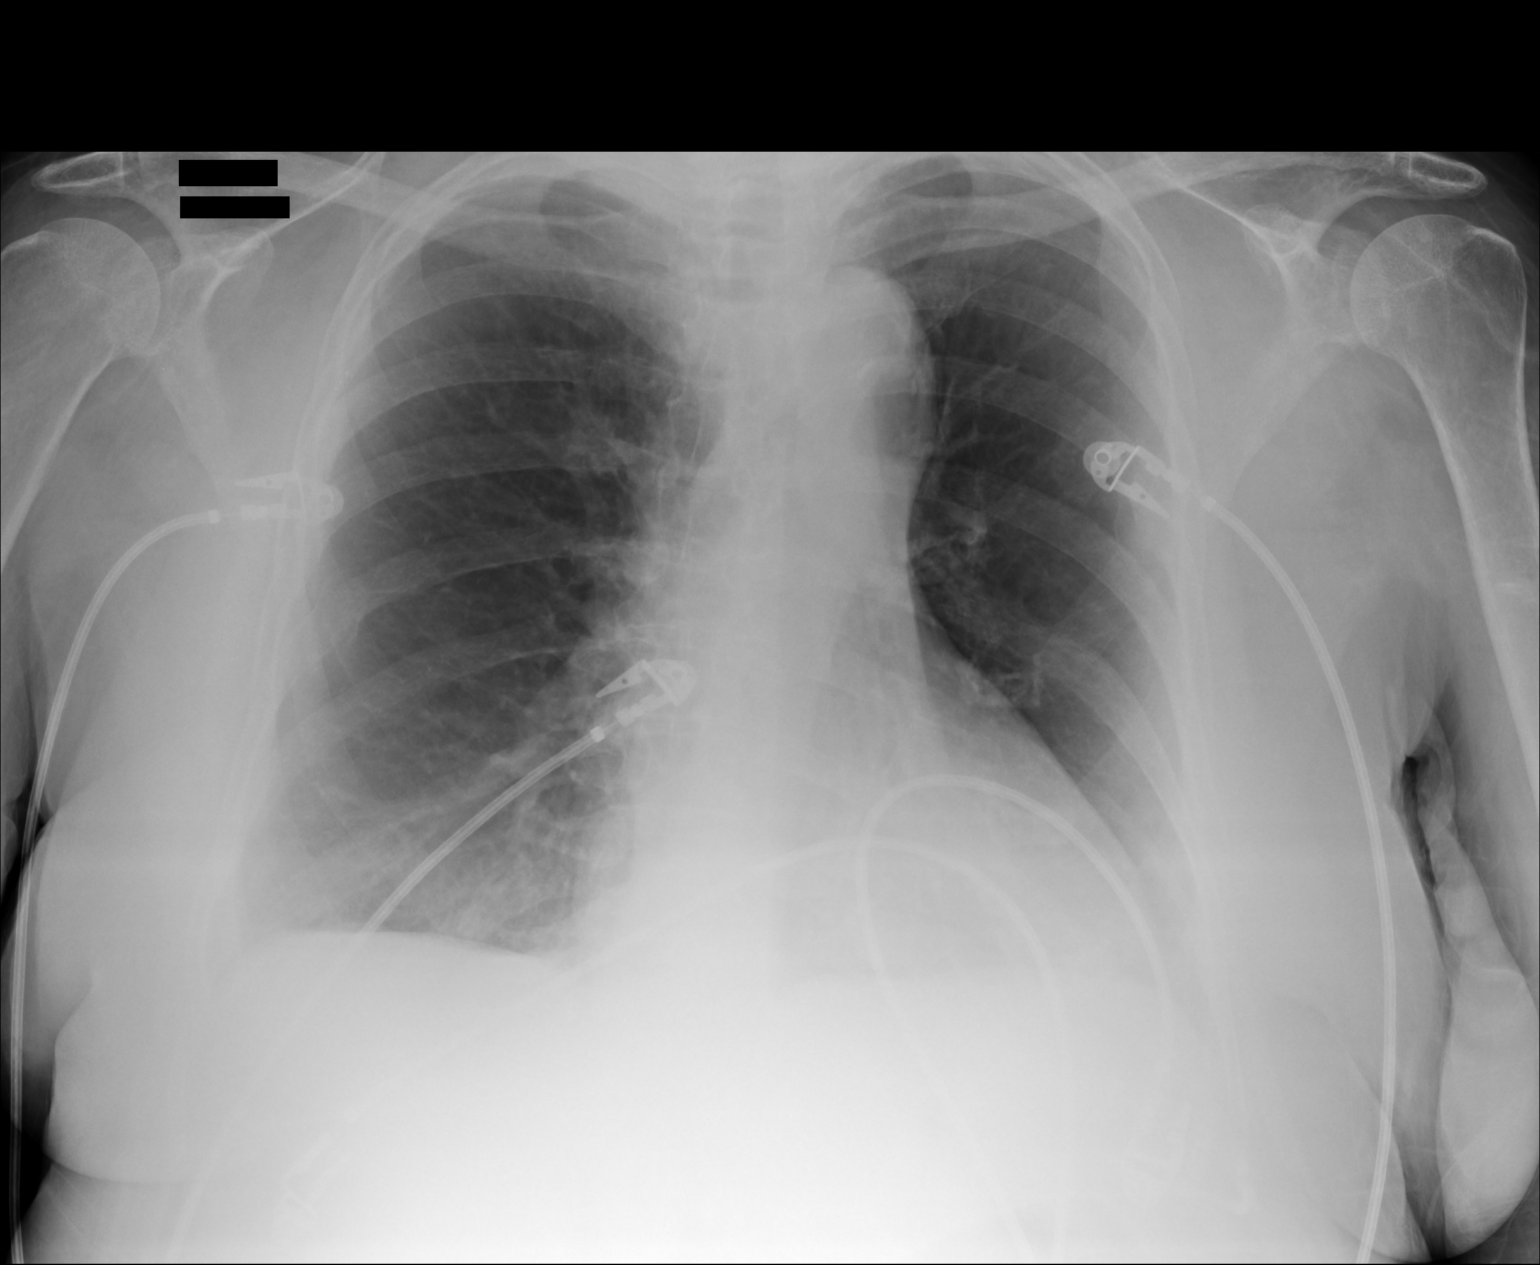

[1 of 1 positions shown; findings below may reference images not displayed]

PROCEDURE:     DXR - DXR PORTABLE CHEST SINGLE VIEW  - July 04, 2011  [DATE]

RESULT:     Comparison is made to previous study 23 June, 2011.

Cardiac monitoring electrodes are present. There is mild hyperinflation.
Cardiac silhouette appears normal. The lungs are clear. Is no edema,
infiltrate, effusion or pneumothorax.
IMPRESSION: 1. Mild hyperinflation. No acute cardiopulmonary disease evident.

## 2013-03-19 IMAGING — CR DG ABDOMEN 1V
1 series · 2 of 2 positions shown · non-contrast
Comparison: none

REASON FOR EXAM: nausea and vomiting
COMMENTS:

[Series 5: t abdomen supine · 0.14mm/px · 2 of 2 slices shown]
[im 1/2]
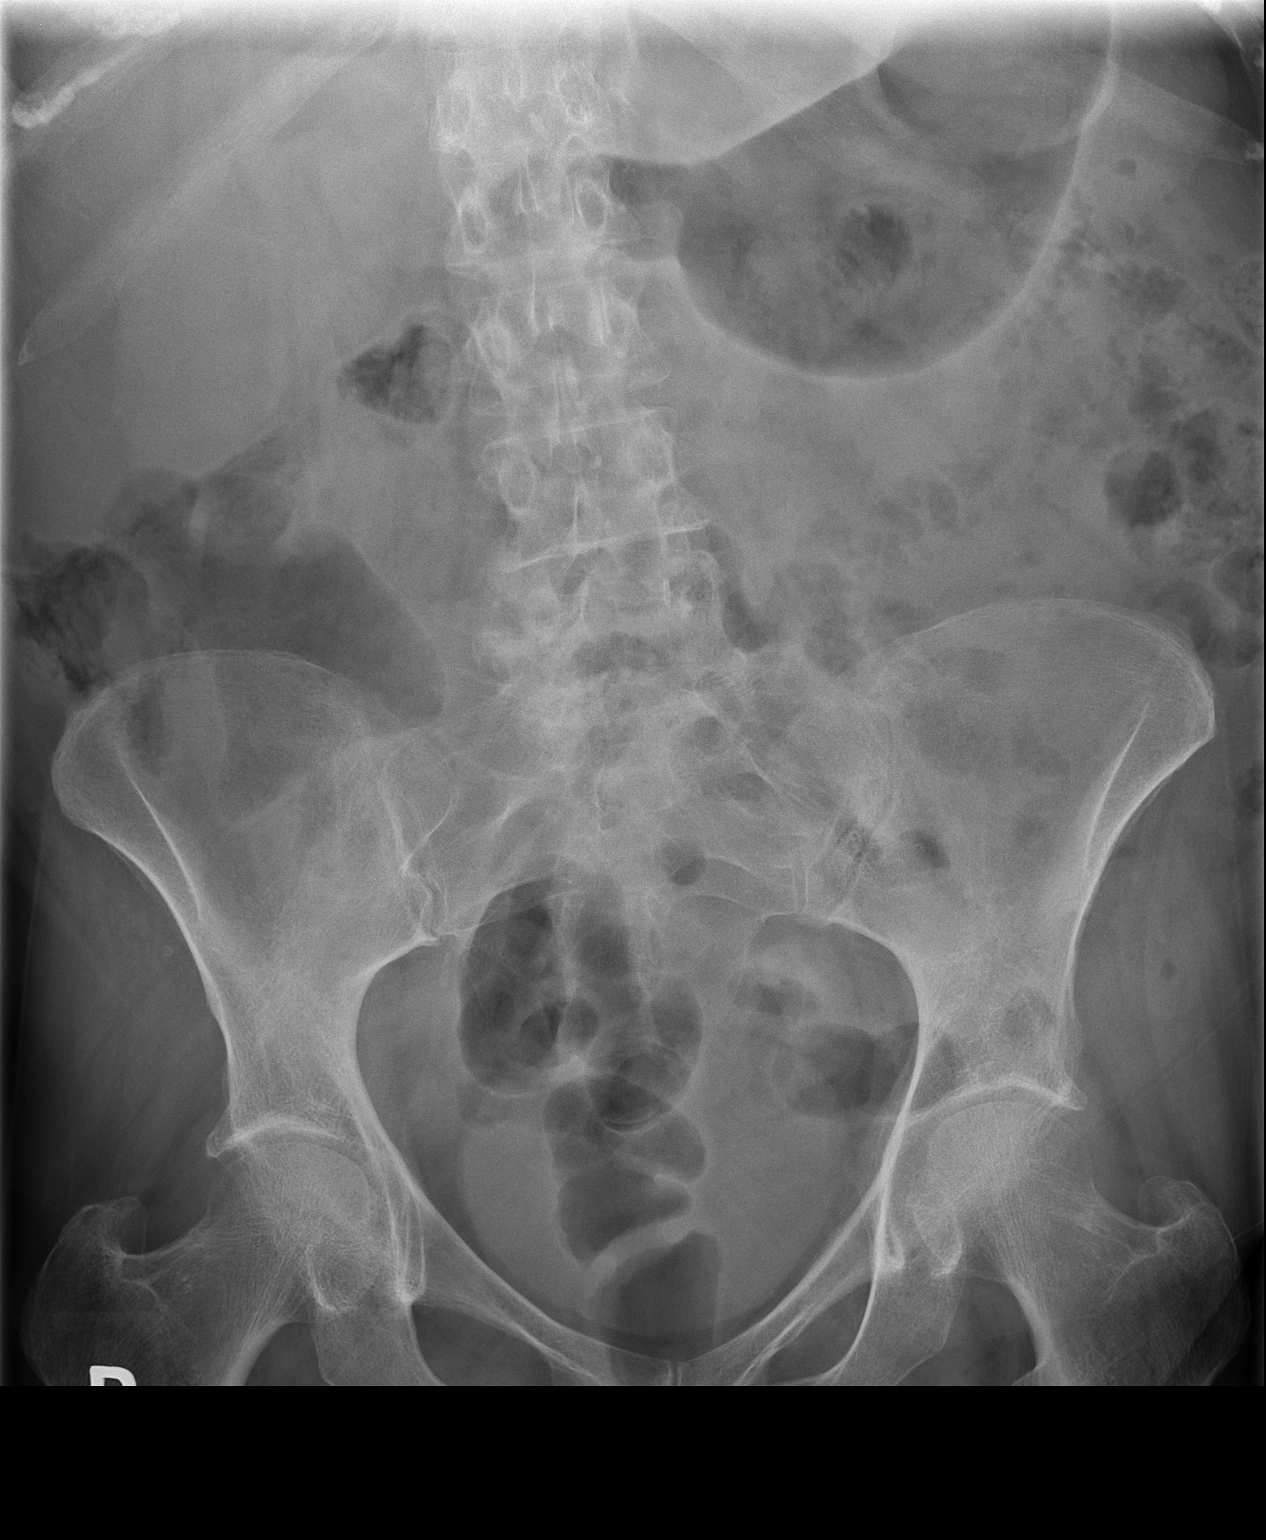
[im 2/2]
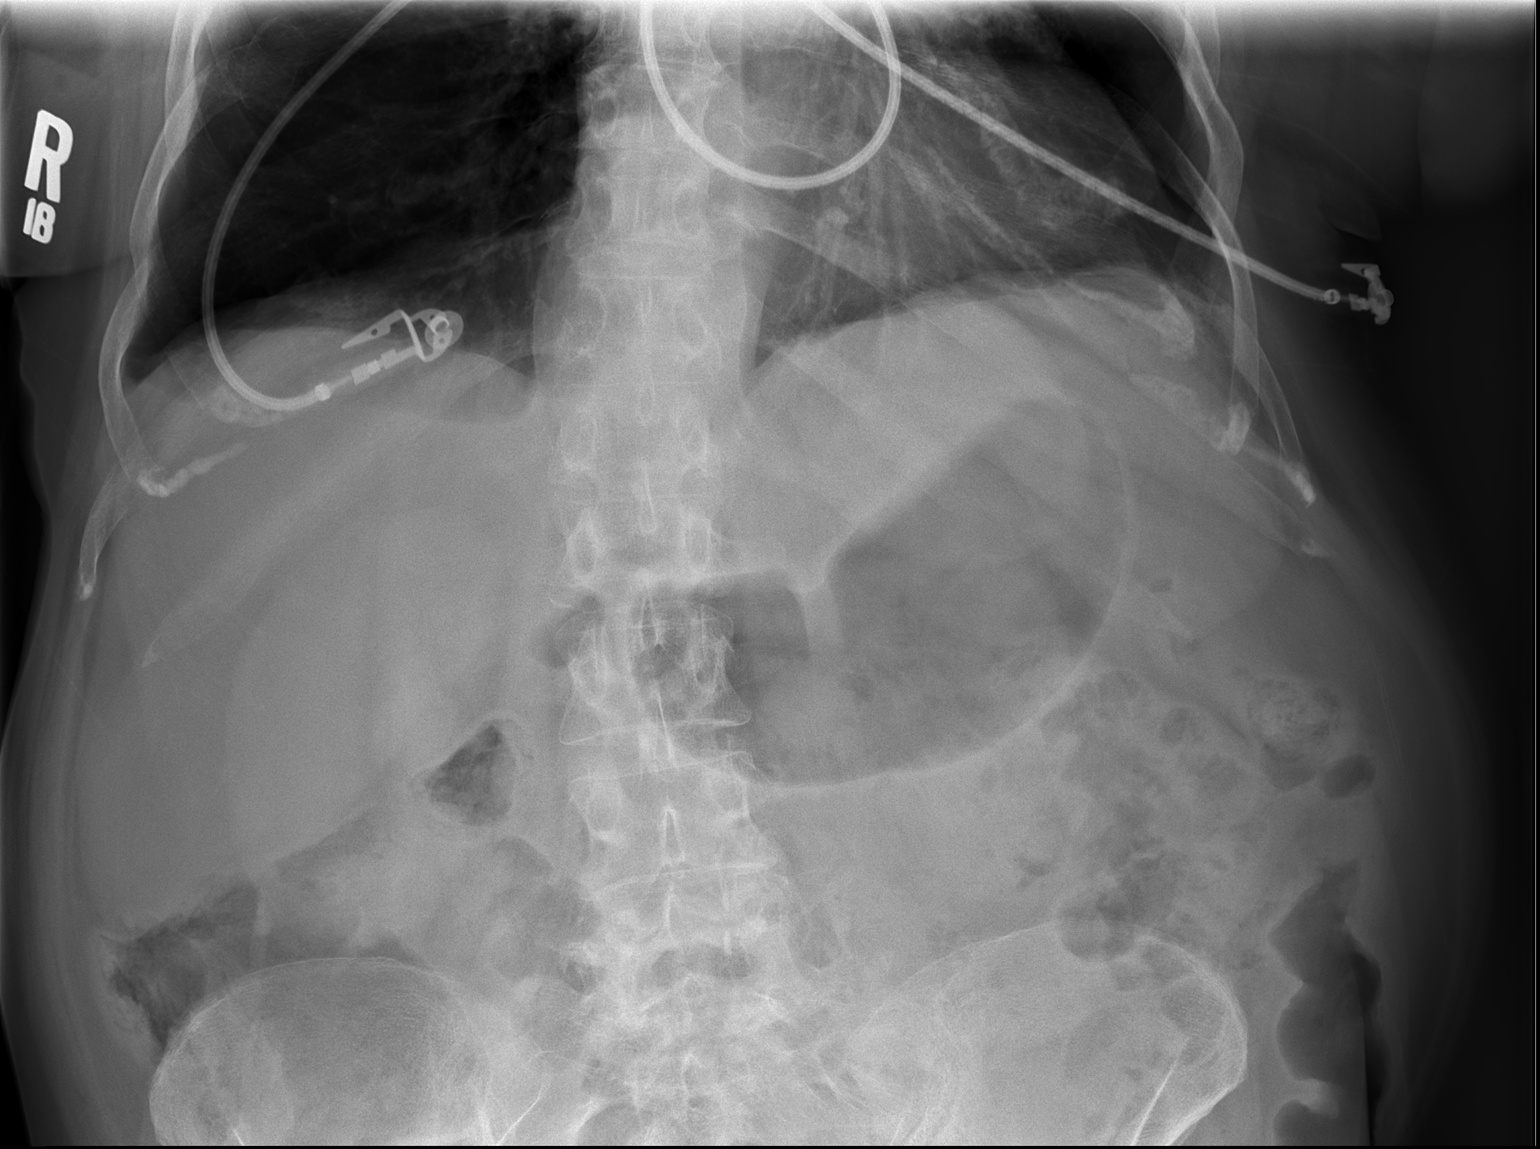

[2 of 2 positions shown; findings below may reference images not displayed]

PROCEDURE:     DXR - DXR KIDNEY URETER BLADDER  - July 06, 2011  [DATE]

RESULT:     The bowel gas pattern is normal. No dilated bowel loops
suspicious for bowel obstruction are seen. There is a mild to moderate
amount of fecal material in the descending colon. No abnormal intraabdominal
calcifications are noted. The osseous structures are normal in appearance.
IMPRESSION: No acute changes are identified.

## 2014-04-07 ENCOUNTER — Emergency Department: Payer: Self-pay | Admitting: Emergency Medicine

## 2014-04-07 LAB — COMPREHENSIVE METABOLIC PANEL
ALT: 10 U/L — AB
ANION GAP: 9 (ref 7–16)
Albumin: 2.4 g/dL — ABNORMAL LOW (ref 3.4–5.0)
Alkaline Phosphatase: 88 U/L
BUN: 15 mg/dL (ref 7–18)
Bilirubin,Total: 0.3 mg/dL (ref 0.2–1.0)
CALCIUM: 9 mg/dL (ref 8.5–10.1)
CREATININE: 1.35 mg/dL — AB (ref 0.60–1.30)
Chloride: 104 mmol/L (ref 98–107)
Co2: 23 mmol/L (ref 21–32)
EGFR (African American): 50 — ABNORMAL LOW
GFR CALC NON AF AMER: 41 — AB
GLUCOSE: 122 mg/dL — AB (ref 65–99)
Osmolality: 274 (ref 275–301)
Potassium: 4.3 mmol/L (ref 3.5–5.1)
SGOT(AST): 27 U/L (ref 15–37)
Sodium: 136 mmol/L (ref 136–145)
Total Protein: 6.4 g/dL (ref 6.4–8.2)

## 2014-04-07 LAB — URINALYSIS, COMPLETE
BILIRUBIN, UR: NEGATIVE
BLOOD: NEGATIVE
GLUCOSE, UR: NEGATIVE mg/dL (ref 0–75)
LEUKOCYTE ESTERASE: NEGATIVE
Nitrite: NEGATIVE
Ph: 5 (ref 4.5–8.0)
Protein: 100
RBC,UR: 1 /HPF (ref 0–5)
Specific Gravity: 1.023 (ref 1.003–1.030)
WBC UR: 2 /HPF (ref 0–5)

## 2014-04-07 LAB — CK TOTAL AND CKMB (NOT AT ARMC)
CK, Total: 55 U/L (ref 26–192)
CK-MB: <0.5 ng/mL — ABNORMAL LOW (ref 0.5–3.6)

## 2014-04-07 LAB — CBC
HCT: 26.3 % — AB (ref 35.0–47.0)
HGB: 8.5 g/dL — ABNORMAL LOW (ref 12.0–16.0)
MCH: 32 pg (ref 26.0–34.0)
MCHC: 32.5 g/dL (ref 32.0–36.0)
MCV: 98 fL (ref 80–100)
Platelet: 207 10*3/uL (ref 150–440)
RBC: 2.68 10*6/uL — ABNORMAL LOW (ref 3.80–5.20)
RDW: 13.6 % (ref 11.5–14.5)
WBC: 7.9 10*3/uL (ref 3.6–11.0)

## 2014-04-07 LAB — TROPONIN I: TROPONIN-I: 0.02 ng/mL

## 2014-04-07 LAB — LIPASE, BLOOD: Lipase: 144 U/L (ref 73–393)

## 2014-04-08 LAB — CA 125: CA 125: 575.4 U/mL — ABNORMAL HIGH (ref 0.0–34.0)

## 2014-04-09 ENCOUNTER — Ambulatory Visit: Payer: Self-pay | Admitting: Obstetrics and Gynecology

## 2014-04-09 LAB — CBC CANCER CENTER
BASOS PCT: 0.4 %
Basophil #: 0 x10 3/mm (ref 0.0–0.1)
Eosinophil #: 0 x10 3/mm (ref 0.0–0.7)
Eosinophil %: 0.4 %
HCT: 26.5 % — ABNORMAL LOW (ref 35.0–47.0)
HGB: 8.7 g/dL — AB (ref 12.0–16.0)
LYMPHS PCT: 11.7 %
Lymphocyte #: 1.1 x10 3/mm (ref 1.0–3.6)
MCH: 31.5 pg (ref 26.0–34.0)
MCHC: 32.7 g/dL (ref 32.0–36.0)
MCV: 96 fL (ref 80–100)
Monocyte #: 0.5 x10 3/mm (ref 0.2–0.9)
Monocyte %: 5.4 %
NEUTROS PCT: 82.1 %
Neutrophil #: 7.6 x10 3/mm — ABNORMAL HIGH (ref 1.4–6.5)
PLATELETS: 266 x10 3/mm (ref 150–440)
RBC: 2.75 10*6/uL — ABNORMAL LOW (ref 3.80–5.20)
RDW: 13.9 % (ref 11.5–14.5)
WBC: 9.3 x10 3/mm (ref 3.6–11.0)

## 2014-04-09 LAB — IRON AND TIBC
IRON BIND. CAP.(TOTAL): 143 ug/dL — AB (ref 250–450)
IRON SATURATION: 25 %
Iron: 36 ug/dL — ABNORMAL LOW (ref 50–170)
Unbound Iron-Bind.Cap.: 107 ug/dL

## 2014-04-22 ENCOUNTER — Emergency Department: Payer: Self-pay | Admitting: Emergency Medicine

## 2014-04-22 LAB — URINALYSIS, COMPLETE
BILIRUBIN, UR: NEGATIVE
Blood: NEGATIVE
GLUCOSE, UR: NEGATIVE mg/dL (ref 0–75)
Granular Cast: 11
Ketone: NEGATIVE
NITRITE: NEGATIVE
Ph: 5 (ref 4.5–8.0)
Protein: 30
RBC, UR: NONE SEEN /HPF (ref 0–5)
SPECIFIC GRAVITY: 1.021 (ref 1.003–1.030)
SQUAMOUS EPITHELIAL: NONE SEEN

## 2014-04-22 LAB — CBC WITH DIFFERENTIAL/PLATELET
BASOS ABS: 0 10*3/uL (ref 0.0–0.1)
Basophil %: 0.3 %
Eosinophil #: 0.2 10*3/uL (ref 0.0–0.7)
Eosinophil %: 1.4 %
HCT: 27.1 % — ABNORMAL LOW (ref 35.0–47.0)
HGB: 8.7 g/dL — ABNORMAL LOW (ref 12.0–16.0)
Lymphocyte #: 1.7 10*3/uL (ref 1.0–3.6)
Lymphocyte %: 15.6 %
MCH: 31 pg (ref 26.0–34.0)
MCHC: 32.1 g/dL (ref 32.0–36.0)
MCV: 97 fL (ref 80–100)
MONO ABS: 0.8 x10 3/mm (ref 0.2–0.9)
MONOS PCT: 7.1 %
Neutrophil #: 8.3 10*3/uL — ABNORMAL HIGH (ref 1.4–6.5)
Neutrophil %: 75.6 %
PLATELETS: 387 10*3/uL (ref 150–440)
RBC: 2.8 10*6/uL — ABNORMAL LOW (ref 3.80–5.20)
RDW: 14.7 % — ABNORMAL HIGH (ref 11.5–14.5)
WBC: 11 10*3/uL (ref 3.6–11.0)

## 2014-04-22 LAB — COMPREHENSIVE METABOLIC PANEL
ALBUMIN: 2.4 g/dL — AB (ref 3.4–5.0)
ALK PHOS: 80 U/L
ANION GAP: 17 — AB (ref 7–16)
BILIRUBIN TOTAL: 0.3 mg/dL (ref 0.2–1.0)
BUN: 70 mg/dL — ABNORMAL HIGH (ref 7–18)
CALCIUM: 9.1 mg/dL (ref 8.5–10.1)
CHLORIDE: 98 mmol/L (ref 98–107)
Co2: 19 mmol/L — ABNORMAL LOW (ref 21–32)
Creatinine: 6.24 mg/dL — ABNORMAL HIGH (ref 0.60–1.30)
EGFR (Non-African Amer.): 7 — ABNORMAL LOW
GFR CALC AF AMER: 9 — AB
GLUCOSE: 89 mg/dL (ref 65–99)
OSMOLALITY: 288 (ref 275–301)
Potassium: 4.4 mmol/L (ref 3.5–5.1)
SGOT(AST): 33 U/L (ref 15–37)
SGPT (ALT): 16 U/L
Sodium: 134 mmol/L — ABNORMAL LOW (ref 136–145)
TOTAL PROTEIN: 7.2 g/dL (ref 6.4–8.2)

## 2014-04-22 LAB — LIPASE, BLOOD: LIPASE: 1104 U/L — AB (ref 73–393)

## 2014-04-27 LAB — CULTURE, BLOOD (SINGLE)

## 2014-05-09 ENCOUNTER — Ambulatory Visit: Payer: Self-pay | Admitting: Obstetrics and Gynecology

## 2014-05-09 DEATH — deceased

## 2014-08-30 NOTE — Consult Note (Signed)
PATIENT NAME:  Grace Reyes, Grace Reyes MR#:  [id number] DATE OF BIRTH:  26-Jun-1940  DATE OF CONSULTATION:  04/22/2014  REFERRING PHYSICIAN:   CONSULTING PHYSICIAN:  [name] Elisabeth PigeonVachhani, MD  ADDENDUM: To earlier dictated note by me. The patient's daughter had a meeting with palliative care team in the Emergency Room, and after meeting with them they decided to admit her to hospice home, so the patient will stay in the ER and Dr. Harvie JuniorPhifer will arrange for hospice admission, so we will cancel the admission to medical service and just leave it as a medical consult meanwhile managing her hospice admission.   ____________________________ [name] Elisabeth PigeonVachhani, MD vgv:sb D: 04/22/2014 09:37:00 ET T: 04/22/2014 10:10:49 ET JOB#: [id number]  cc: [name] Elisabeth PigeonVachhani, MD, <Dictator> [name] ELECTRONICALLY SIGNED 04/23/2014 21:44

## 2014-08-30 NOTE — H&P (Signed)
PATIENT NAME:  Grace Reyes, Grace Reyes MR#:  [id number] DATE OF BIRTH:  08/02/1940  DATE OF ADMISSION:  04/22/2014  PRIMARY CARE PHYSICIAN: [facility]   REFERRING EMERGENCY ROOM PHYSICIAN: Enedina FinnerRandolph N. Manson PasseyBrown, MD  CHIEF COMPLAINT: Lethargy and low blood pressure.   HISTORY OF PRESENT ILLNESS: A 74 year old female who is a resident of [facility] for the last 3 years because of severe and terminal COPD requiring oxygen and wheelchair-bound. History is obtained from the patient's daughter, who is present in the room. The patient had on-and-off lower abdominal pain for the last few weeks, and 3-4 weeks ago she had a CAT scan of the abdomen done, which showed a 17 cm mass in her pelvis and she was referred to GYN oncology and then the Cancer [facility]. She is not a surgical candidate due to her terminal COPD, and was referred to the Cancer Center for palliative chemotherapy or biopsy for diagnosis, but she decided not to go for that, as she is already fighting with COPD and it would be too much for her to go through all of this, and decided to have hospice services at [facility]. So for the last 2-3 weeks, she has also had hospice services at [facility], and has been getting medications to control her pain and nausea.   As per daughter, for the last 2-3 weeks, since she was diagnosed with cancer, she has had severe pain and nausea, and so she is not eating too much. For the last 1-2 weeks, she is also refusing her medications at the nursing home. So the doctor also ordered to give her crush medications with applesauce, but she is even not eating or drinking too much. She is progressively getting more and more lethargic. In the last 1 week, she is remaining sleepy most of the time and fluctuating in her mental level and oriented, as per daughter. Today, the nurse checked her blood pressure, which was running on the lower side, 70s systolic, and so decided to send her to the Emergency Room for  further evaluation.   In the ER, her blood work showed that she has acute renal failure. Her creatinine is significantly worse, compared to when it was checked 15 days ago, and she is noted to have hypotension. So, she was given to the hospitalist team for management of these issues.   REVIEW OF SYSTEMS: Unable to get, as the patient is not able to give me any history.   PAST MEDICAL HISTORY:  1.  Recently diagnosed pelvic mass, and the patient decided not to go for any further workup or treatment for that, and signed up for hospice services.  2.  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  3.  Chronic respiratory failure.  4.  Essential hypertension.  5.  Hyperlipidemia.  6.  Coronary artery disease.  7.  Diastolic dysfunction.  8.  Chronic back pain.  9.  Ex-smoker.  10.  Depression.  11.  Gastroesophageal reflux disease.  12.  Sinus tachycardia.  13.  MRSA infection of the left eye in the past.  14.  Chronic sinusitis.  15.  Anemia of chronic disease.  16.  Anxiety.   PAST SURGICAL HISTORY:  1.  Appendectomy.  2.  Hysterectomy.  3.  Sinus surgery.  4.   Cataract surgery.  FAMILY HISTORY: According to the medical record, the patient has a family history of stroke, emphysema, and myocardial infarction.   SOCIAL HISTORY: She lives at [facility], requiring oxygen and wheelchair-bound, and she is on hospice services currently. She was a smoker in the past, 2 packs per day for almost 40 years. No alcohol or illegal drug use.   HOME MEDICATIONS:  1.  Vitamin D3, 1000 international units once a day.  2.  Trazodone 150 mg oral 3 tablets once a day.  3.  Symbicort 160 mcg 2 puffs inhalation 2 times a day.  4.  Spiriva 18 mcg 1 capsule inhalation once a day.  5.  Bactrim double-strength tablet once a day.  6.  Senna 2 tablets 2 times a day.  7.  Q-tussin 10 mL oral every 6 hours as needed for cough.  8.  Polyethylene glycol 17 Reyes once a  day.  9.  Oxybutynin 5 mg oral 2 times a day.  10.  Ondansetron 4 mg oral every 6 hours as needed for nausea.  11.  Nexium 40 mg oral once a day.  12.  Montelukast 10 mg oral once a day.  13.  Milk of Magnesia 30 mL once a day.  14.  Mapap 500 mg oral tablet every 4 hours for pain and fever.  15.  Lorazepam 1 mg oral tablet 4 times a day as needed for anxiety.  16.  Loperamide 2 mg oral tablet with each loose stool.  17.  Ferrous sulfate 325 mg oral 2 times a day.  18.  Cetirizine 10 mg oral tablet once a day.  19.  Bupropion 150 mg 3 tablets once a day.  20.  Atorvastatin 10 mg once a day.  21.  Atenolol 50 mg once a day.  22.  Aspirin 325 mg once a day.  23.  Albuterol and ipratropium inhalation solution 3 mL 4 times a day.   PHYSICAL EXAMINATION:  VITAL SIGNS: In the ER, temperature 97.5 pulse is 80, respirations are 20, blood pressure went lowest to 74/48/ in the ER after IV fluid came to 110/74. Pulse oximetry is 95% on 3 L oxygen supplementation.  GENERAL: The patient is drowsy and lethargic, but groans with pain on stimuli. Does not follow commands, but has slight movement of the limbs.  HEENT: Head and neck atraumatic. Conjunctivae pink. Oral mucosa dry.  NECK: Supple. No mass. No JVD.  RESPIRATORY: Oxygen per nasal cannula in use.  Bilateral equal air entry. Decreased air entry. No wheezing or crepitation.  CARDIOVASCULAR: S1, S2 present, regular. No murmur.  ABDOMEN: Soft and nontender. There is a mass felt in the lower abdomen. Bowel sounds  normal.  SKIN: No acne, rashes, or lesions.  LEGS: Mild edema present.  JOINTS: No gross swelling or tenderness.  NEUROLOGICAL: Currently, lethargic. No rigidity or tremors. Minimal withdrawal to painful stimuli. Does not follow commands. Unable to check the cranial nerves because of lack of participation, but pupils are reactive.  PSYCHIATRIC: Unable to check, because she is currently not responding very well.  IMPORTANT LABORATORY  RESULTS: CT scan of abdomen and pelvis shows bilobed pelvic mass, similar to that seen on prior exam. Kidneys without any calculi or obstructive changes.   CT scan of the head is done for confusion. No acute intracranial pathology. Chronic microvascular disease and cerebral atrophy.   Glucose 89, BUN 70, creatinine 6.24, sodium 134, potassium 4.4, chloride 98, CO2 of 19, potassium 9.1, lipase 1104.   Total protein 7.2, albumin 2.4, bilirubin 0.3, alkaline phosphate 80, SGOT 33 and SGPT 16.   WBC 11, hemoglobin 8.7, platelet count 387, MCV is 97.  Urinalysis is positive, with 2+ leukocyte esterase and 54 WBCs in the urine with cloudy color.   ASSESSMENT AND PLAN: A 74 year old female who has terminal chronic obstructive pulmonary disease and is on oxygen use and is wheelchair-bound at nursing home, recently diagnosed with a 17 cm mass in the pelvis, and she chose not to go for any palliative chemotherapy or biopsies. Currently on hospice services in nursing home. She came with failure to thrive, hypotension and altered mental status.  1.  Altered mental status. Most likely this is because of metabolic encephalopathy due to severe dehydration and renal failure. CT scan of the head is negative. We will continue monitoring.  2.  Acute renal failure. Creatinine was 1.3 when checked15 days ago; now it is more than 6; most likely this is secondary to her dehydration. There are no obstructive changes on CT abdomen today. We will give IV fluid; already bolus was given by ER. We will continue normal saline and follow renal function. Currently, there is no electrolyte imbalance requiring hemodialysis urgently. Most likely this is due to acute tubular necrosis secondary to acute dehydration.  3.  Failure to thrive. As per daughter, the patient has nausea and pain due to cancer for the last 2- 3 weeks. She also had a history of depression, and so after being diagnosed with cancer, she is not eating or drinking  very well. Because of multiple medical issues, it looks like the patient is a candidate for hospice home services. I discussed with daughter, and she agreed for that. I will call a palliative care consult for further management and help the family make this decision.  4.  Chronic anemia. This might be secondary to her malignancy. She is on iron supplemental at home; currently, we will hold it as she is lethargic and not taking medications.  5.  Urinary tract infection. Urinalysis is positive. We will start her on Levaquin and get urine culture. Blood culture was already sent in by the ER physician  6.  Terminal chronic obstructive pulmonary disease and chronic respiratory failure requiring oxygen at home. Currently, there is no acute wheezing. Will continue her nebulizer and Spiriva.  7.  History of hypertension. Currently, she is having low blood pressure, so we will not give any of her medications.   CODE STATUS: DNR.  The plan was discussed with patient's daughter, and she is in agreement for the plan up to now, and would like to meet with Dr. Harvie Junior to have further planning.   TOTAL TIME SPENT ON THIS ADMISSION: 50 minutes.    ____________________________ Grace Reyes Elisabeth Pigeon, MD vgv:MT D: 04/22/2014 09:20:03 ET T: 04/22/2014 09:50:48 ET JOB#: [id number]  cc: Grace Reyes. Elisabeth Pigeon, MD, <Dictator> Altamese Dilling MD ELECTRONICALLY SIGNED 04/23/2014 21:44

## 2014-08-31 NOTE — Consult Note (Signed)
Brief Consult Note: Diagnosis: MDD recurrent in remission, Delirium secondary to medication/medical condition.   Patient was seen by consultant.   Consult note dictated.   Recommend further assessment or treatment.   Orders entered.   Discussed with Attending MD.   Comments: Grace Reyes ha a h/o depression on Wellbutrin and Trazodone. She was admitted for AMS. Of note, she was positive for amphetamines on admission. She was confused and hallucinating this morning per nursing staff, family and the patient herself. She sees peaople from her past, non threatening [facility]. Spoke with the family of risk associated with using antipsychotics in elderly. They agree to use it for now.  MSE: Alert and fully oriented now. Reports visual hallucinations earlier. She is in visible distress from tailbone pain as she is sitting in the chair as PT wants her.   PLAN: 1. Delirium is a medical emergency. Please avoid benzodiazepines and anticholinergics as they cloud cognition.  2. I will add low dose haldol. There is no agiattion.   3. Please continie Trazodone and Wellbutrin.  3. I will follow up.  Electronic Signatures: Kristine LineaPucilowska, Davene Jobin (MD)  (Signed 16-Feb-13 22:00)  Authored: Brief Consult Note   Last Updated: 16-Feb-13 22:00 by Kristine LineaPucilowska, Stalin Gruenberg (MD)

## 2014-08-31 NOTE — Consult Note (Signed)
Brief Consult Note: Diagnosis: MDD recurrent in remission, Delirium secondary to medication/medical condition.   Patient was seen by consultant.   Consult note dictated.   Recommend further assessment or treatment.   Orders entered.   Comments: [name] ha a h/o depression on Wellbutrin and Trazodone. She was admitted for AMS. Of note, she was positive for amphetamines on admission. She is alert and fully oriented now.  PLAN: 1. Delirium is a medical emergency. Please avoid benzodiazepines and anticholinergics as they cloud cognition.  2. Please continie Trazodone and Wellbutrin.  3. I will follow up.  Electronic Signatures: Kristine LineaPucilowska, Idy Rawling (MD)  (Signed 15-Feb-13 16:37)  Authored: Brief Consult Note   Last Updated: 15-Feb-13 16:37 by Kristine LineaPucilowska, Garek Schuneman (MD)

## 2014-08-31 NOTE — Consult Note (Signed)
PATIENT NAME:  Grace Reyes, Grace Reyes MR#:  [id number] DATE OF BIRTH:  04-16-1941  DATE OF CONSULTATION:  07/07/2011  REFERRING PHYSICIAN:   CONSULTING PHYSICIAN:  Audery Amel, MD  IDENTIFYING INFORMATION AND REASON FOR CONSULT: 74 year old woman with a history of congestive heart failure and chronic obstructive pulmonary disease brought into the hospital because of altered mental status. Consult for altered mental status.   HISTORY OF PRESENT ILLNESS: Information is obtained from the patient and from review of the chart. The patient was brought to the hospital a couple of days ago with an altered mental status. At that time it is documented that she was unable to give much of a history. She seems to have been quite sedated at that time. It is not entirely clear what might have been going on. Possibly exacerbation of chronic obstructive pulmonary disease, possibly medication related although nothing specific is known. As of yesterday, the treatment team was documenting that her mental state was significantly improved. Today, the patient tells me that she does not remember coming into the hospital. She says she remembers telling her daughter that she needed to come to the hospital but cannot even remember why she felt like she needed to come into the hospital. She cannot remember any particular symptoms that she was having at home. She claims that she had been functioning reasonably well at home, had been able to ambulate and do her usual activities of daily living. She tells me that she thinks she had tried to stop some of her medicine. She cannot recall the names of most of her medications and cannot recall which one she might have adjusted. She is quite vague on this point and seems confused about it. When I asked her which medicine she thought she would stop, she told me the antidepressants but could not give me any rationale for it. When I asked again, she told me her blood pressure medicine which she  said she thought made her nauseous. She denies that she had taken excessive amounts of any of her medication or tried to alter her care. Absolutely denies intentionally taking an overdose of any medicine. Currently, she describes her mood as feeling good. Denies any suicidal ideation. She says that she is feeling pretty much back to her normal mental state. She tells me that she does have chronic pain which is in her lower back. Currently, the morphine is being held. I asked her if her pain was much worse today and she said that it was about the same as always. She told me that she cannot tell that the morphine has ever been particularly effective for her.   PAST PSYCHIATRIC HISTORY: She was just discharged from the hospital about a week ago after another hospitalization for altered mental status. At that time, Dr. Jennet Maduro saw her and found her to be a little bit delirious. The patient cleared up with medical treatment. She is currently taking Wellbutrin 300 mg a day. Looking back over her old records, it appears that she has been taking that for years. I am not sure whether it was originally prescribed for depression or smoking cessation. The patient is vague in stating that she has had depression in the past, but cannot really describe anything about it. She does not remember whether the Wellbutrin was ever particularly helpful for her. She denies any past history of suicidal behavior or violence. Denies that she has ever had a psychiatric hospitalization. On her previous admission, she was having visual hallucinations  at one point. When I asked her today, she says she has not had any of those on this admission.   PAST MEDICAL HISTORY:  1. Severe chronic obstructive pulmonary disease. 2. Chronic back pain.  3. History of hypertension. 4. Hyperlipidemia. 5. Coronary artery disease.  6. Gastric reflux disease and indigestion.  7. Methicillin Resistant Staphylococcus Aureus infection.  8. She told me  she had congestive heart failure, but I do not see that listed in her current diagnoses.   MEDICATIONS: The medications listed on admission here were: 1. Spiriva one inhalation once a day.  2. Wellbutrin 300 mg a day.  3. Trazodone 450 mg at night.  4. Aspirin 325 mg a day.  5. Triamcinolone two sprays to each nostril once a day.  6. Lipitor 10 mg at bedtime.  7. Symbicort 2 puffs twice a day.  8. Cardizem CD 300 mg once a day.  9. MS Contin 15 mg twice a day. 10. Flexeril 10 mg 3 times a day.  11. DuoNebs p.r.n.  12. Nexium 40 mg a day. 13. Vigamox eyedrops 3 times a day.  14. Evidently, the trazodone dose really is 450 mg at night. That is a pretty big dose.   REVIEW OF SYSTEMS: She complains to me of indigestion and nausea. Complains of her chronic low back pain. Some shortness of breath. Feeling weak and tired. No mood complaints. No thought disorder or hallucination complaints.   MENTAL STATUS EXAM: Sick-appearing woman interviewed in a hospital bed. She was awake, alert and oriented and cooperative with the interview. Made good eye contact. Psychomotor activity decreased by sickness. Speech was easy to understand. Affect was a little bit constricted. Mood was stated as okay. Thoughts were slow, but lucid. No evidence of bizarre thinking or thought disorder. Denied any auditory or visual hallucinations. Denied any suicidal or homicidal ideation. She was alert and oriented completely to place and situation. It took her a couple of tries to get the date right, but eventually she did. She was actually able to remember three out of three objects at three minutes. Able to hold a lucid conversation. One point in her cognitive functioning I noticed is that she does not know the names of most of her medicines. She also does not know the dosage of any of her medicines as far as I can tell. She seems confused about dosing a lot of her medicines. Judgment and insight adequate.   ASSESSMENT:  74 year old woman with chronic obstructive pulmonary disease, multiple medical problems, admitted to the hospital with altered mental status. Right now, she seems to have improved. I am not sure if this is completely back to her baseline, but it is probably pretty close. She is a little bit slowed and confused about her medicines, but I suspect that may be chronic. She is not showing any signs of depression and is not delirious. I see that several of her medications have been held on this hospital stay. I would also note that there was no drug screen that I see done on this hospitalization. That might have been useful. Although it would not have given a quantitative amount, it would have shown if she had possibly had any benzodiazepines. The patient seems to be resolved from her acute mental status problems. Probably due in part to treatment of her chronic obstructive pulmonary disease and medical problems, possibly in part due to stabilizing some of her medicines. From what she tells me, it sounds like the morphine and probably  the Flexeril have been of dubious help at best and she is probably just tolerant on them at this point. They have been held right now and she does not seem to be in much worse pain from what she tells me than usual. I also note that the dose of trazodone that she takes is pretty high and while it is not out of the range for use as an antidepressant, it may be enough to cause some chronic sedation.   RECOMMENDATIONS: If it is possible, I would try to limit or even not restart her narcotics. Obviously, her pain needs to be addressed, but they may be a factor in her sedation. I would suggest not restarting the Flexeril which probably is of minimal benefit and may be increasing her sedation. We might want to only restart the trazodone at a lower dose if restarting at all. The Wellbutrin should not be causing any sedation or mental status changes, so I would not change that. Overall, it might  be considered whether she is reaching the point where independent living is not her safest option. I am particularly concerned that she may not be able to safely dose all of her own medicines on her own. She is telling me that right now she does that. If that is true, there might be some consideration given to trying to get someone else to dose and package or at least closely monitor her medication. No other psychiatric intervention at this point.   DIAGNOSIS PRINCIPLE AND PRIMARY:  AXIS I: Delirium due to generalized medical condition, now resolved.   SECONDARY DIAGNOSES:  AXIS I: History of depression in remission.   AXIS II: No diagnosis.   AXIS III:  1. Chronic obstructive lung disease. 2. Hypertension. 3. Coronary artery disease. 4. Chronic low back pain. 5. Multiple medical problems.   AXIS IV: Moderate to severe from chronic illness and living on her own.   AXIS V: Functioning at time of evaluation 50.   ____________________________ Audery Amel, MD jtc:ap D: 07/07/2011 12:40:15 ET T: 07/07/2011 14:17:54 ET JOB#: [id number]  cc: Audery Amel, MD, <Dictator> Audery Amel MD ELECTRONICALLY SIGNED 07/07/2011 15:37

## 2014-08-31 NOTE — Consult Note (Signed)
Chief Complaint:   Subjective/Chief Complaint Patient brought to endoscopy for an EGD. Patient still very SOB and anesthesia has deemed her very high risk for anesthesia related complications. Patient feels that she is about 40 % better in terms of her odynophagia. EGD has been cancelled on the request of anesthesia. Her chest discomfort is a lot left of midline and may not be related to esophagus. Recommend continuing Diflucan for a total of 7 days. EGD as OP after resolution of her pulmonary issues. Discussed with her and Dr. Allena KatzPatel. Will sign off. Please reconsult if needed. Thanks.   VITAL SIGNS/ANCILLARY NOTES: **Vital Signs.:   06-Mar-13 08:08   Vital Signs Type Routine   Temperature Temperature (F) 98.7   Celsius 37   Temperature Source oral   Pulse Pulse 82   Respirations Respirations 20   Systolic BP Systolic BP 134   Diastolic BP (mmHg) Diastolic BP (mmHg) 80   Mean BP 98   BP Source Dinamap   Pulse Ox % Pulse Ox % 99   Pulse Ox Activity Level  At rest   Oxygen Delivery 2L; Nasal Cannula   Electronic Signatures: Lurline DelIftikhar, Aislee Landgren (MD)  (Signed 06-Mar-13 10:30)  Authored: Chief Complaint, VITAL SIGNS/ANCILLARY NOTES   Last Updated: 06-Mar-13 10:30 by Lurline DelIftikhar, Milicent Acheampong (MD)

## 2014-08-31 NOTE — H&P (Signed)
PATIENT NAME:  Grace Reyes, WONNACOTT MR#:  [id number] DATE OF BIRTH:  1941/01/23  DATE OF ADMISSION:  05/19/2011  REFERRING PHYSICIAN: Dr. Sharma Covert   PRIMARY CARE PHYSICIAN: [facility] in [place]    CHIEF COMPLAINT: Shortness of breath.   HISTORY OF PRESENT ILLNESS: The patient is a 74 year old Caucasian female with history of severe steroid dependent, oxygen dependent COPD and chronic respiratory failure, diastolic CHF and CAD who presents with shortness of breath and fevers. The symptoms started earlier today. There is no productive coughing, however, there is some mild wheezing. The patient was found to be lethargic by family members and had low energy. She was labored in her breathing and 9-1-1 was called. She is also with Hospice at home and Hospice nurse found her to have a fever of 102.9 per her family. She has no dizziness or chest pains. She does complain of some dysuria in the last couple of days without an increase in frequency or hematuria. There are no other URI symptoms. On arrival here, she had pulse rate of 125 and a fever of 100.8. The hospitalist services were contacted for further evaluation. Her sister recently also had an illness and "neck infection" needing antibiotics.   PAST MEDICAL HISTORY:  1. Hypertension. 2. Hyperlipidemia. 3. Coronary artery disease. 4. Diastolic dysfunction. 5. 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 6. Former tobacco abuser. 7. Depression. 8. Gastroesophageal reflux disease.   9. Chronic sinus tachycardia.  10. Chronic MRSA of the left eye. 11. Chronic history of sinusitis and conjunctivitis with MRSA, followed by ENT, Dr. Elenore Rota. 12. Advanced chronic obstructive pulmonary disease with chronic respiratory failure on 3 liters of oxygen around-the-clock. 13. Anxiety.   ALLERGIES: IV pyelogram dye.   MEDICATIONS AT HOME:  1. Aspirin 325 mg daily.  2. Atropine two drops under tongue every four  hours. 3. Bacitracin one application to both eyes every six hours.  4. Cardizem 300 mg CD daily.  5. Cyclobenzaprine 10 mg t.i.d. p.o.  6. Symbicort 160/4.5 mcg. 7. Dulcolax as needed.  8. Nexium 40 mg daily.  9. Lasix 20 mg daily as needed. 10. Lipitor 10 mg daily.  11. Loperamide 2 mg one cap for diarrhea as needed.  12. MS Contin 15 mg twice daily.  13. Prednisone 20 mg b.i.d.  14. ProAir as needed.  15. Senna Plus 1 to 2 tabs as needed.  16. Trazodone 450 mg at bedtime.  17. Wellbutrin XL 300 mg daily.  18. Lorazepam 1 mg q.i.d. as needed. 19. Triamcinolone nasal spray daily. 20. Vigamox 0.5 mg one drop into both eyes t.i.d.  21. Combivent inhaler as needed.   FAMILY HISTORY: Significant for MI and CVA.   SOCIAL HISTORY: Extensive tobacco abuse with 2 packs of cigarettes per day from age 47 until 2010. No alcohol use. No drug use. Lives with family in [place] with her sister and has home Hospice set up.   REVIEW OF SYSTEMS: CONSTITUTIONAL: Positive for fever, lethargy, and fatigue. No weight changes. EYES: Chronic MRSA infection. No double vision or redness. ENT: No tinnitus or hearing loss. Chronic sinusitis. RESPIRATORY: No cough. There is wheezing, chronic dyspnea, chronic COPD. History of previous pneumonias. CARDIOVASCULAR: No chest pain, orthopnea, or edema. History of sinus tachycardia. No palpitations or syncope. GI: No nausea, vomiting, or diarrhea. No rectal bleeding. GU: Dysuria. No frequency or incontinence. No hematuria. ENDOCRINE: No polyuria or nocturia. HEME/LYMPH: History of anemia. No bleeding or swollen glands. SKIN: No rashes. MUSCULOSKELETAL:  Chronic joint pain and arthritis. NEUROLOGIC: No numbness, weakness, or history of CVA. PSYCH: Anxiety and depression.   PHYSICAL EXAMINATION:   VITAL SIGNS: Temperature on arrival 100.8, pulse 125, respiratory rate 18, blood pressure 114/63, oxygen saturation 99%.   GENERAL: The patient is an obese Caucasian female sitting  in bed in mild respiratory distress talking in full sentences.   HEENT: Normocephalic, atraumatic. Pupils are equal and reactive. Anicteric sclerae. Moist mucous membranes. No tonsillar exudate.   NECK: Supple. No thyroid tenderness. No lymphadenopathy.   CARDIOVASCULAR: S1, S2 tachycardic. Diminished heart sounds. No murmurs appreciated.   LUNGS: Diffuse wheezing and decreased breath sounds in bilateral lung fields.   ABDOMEN: Soft, nontender, nondistended. Positive bowel sounds.   EXTREMITIES: No significant edema.   NEURO: Cranial nerves II through XII grossly intact. Strength 5 out of 5 in all extremities.   LABORATORY, DIAGNOSTIC, AND RADIOLOGICAL DATA: BUN 6, creatinine 0.61, potassium 3.4, chloride 99, sodium 139, albumin 2.9. CK-MB 0.6. CK total 147. WBC 8.7, hemoglobin 10.2, hematocrit 30.4, platelets 279. INR 0.9. Urinalysis no nitrites or leukocyte esterase, 1 WBC, no bacteria. ABG pH 7.48, pCO2 42, pO2 107 on oxygen. EKG sinus tachycardia with PVC, nonspecific ST changes, no acute ST elevations or depressions. X-ray of the chest, PA and lateral, done; result pending, however, no focal infiltrate seen.   ASSESSMENT AND PLAN: We have a 74 year old Caucasian female with history of oxygen-dependent, steroid-dependent advanced COPD with multiple admissions for exacerbations in respiratory failure, CAD, hypertension with home Hospice who presented with shortness of breath, fever, tachycardia, respiratory failure likely in the setting of COPD exacerbation and sepsis source being possibly UTI versus bronchitis versus viral URI. The patient has been given Levaquin. The patient has fever here, however, no leukocytosis. She has improved with treatment thus far and her lethargy has improved. We will start Solu-Medrol IV around-the-clock as well as continue Symbicort, Spiriva, and start scheduled and p.r.n. and nebulizer treatments. Keep the oxygen sats in the low 90's. ABG did not show significant  CO2 retention. The patient has been cultured. Would check a rapid Influenzae test as well and start the patient on Levaquin for now. Would follow the fever curve. Will also check a sputum culture. Would continue to monitor for the sepsis source but I suspect it would be upper or lower respiratory related. Would continue the patient's anxiety and depression medications. The patient is also on standing opiates for chronic pain which we would continue. The patient does have history of CAD and would continue her statin, aspirin, and calcium channel blocker. She has no chest pain now and there is no acute EKG changes. Her blood pressure is adequately controlled and would continue her outpatient medications for her hypertension. Would start the patient on Lovenox for DVT prophylaxis.   On arrival she had initially rescinded her DNR/DNI, however, I approached the patient with her family members including her daughter in the room. The patient stated that she did not know what she was doing, however, at this point, she would want to be DO NOT RESUSCITATE and DO NOT INTUBATE and understands that she does not want to be intubated. She does not want to be resuscitated either. She does not want to be on the vent and knows that if she is ever intubated would likely have a very difficult time and likely impossible to be weaned off of it and would need trach.   TOTAL TIME SPENT: 55 minutes.   ____________________________ Krystal Eaton, MD sa:drc D:  05/19/2011 19:43:29 ET T: 05/20/2011 06:18:34 ET JOB#: [id number]  cc: Krystal EatonShayiq Ikenna Ohms, MD, <Dictator> [facility] MD ELECTRONICALLY SIGNED 05/20/2011 20:38

## 2014-08-31 NOTE — Discharge Summary (Signed)
PATIENT NAME:  Grace Reyes, Emojean G MR#:  [id number] DATE OF BIRTH:  1940-08-16  DATE OF ADMISSION:  05/19/2011 DATE OF DISCHARGE:  05/24/2011  DIAGNOSES:  1. Acute on chronic respiratory failure due to chronic obstructive pulmonary disease exacerbation.  2. Acute bronchitis. 3. Possible sepsis on admission. 4. Anxiety. 5. Coronary artery disease.  6. Chronic pain. 7. Hypertension. 8. Diarrhea. 9. History of diastolic dysfunction with no evidence of decompensation.  10. Hypokalemia. 11. Anemia of chronic disease.  12. History of smoking in the past.  13. Hyperlipidemia.  14. Degenerative joint disease involving the cervical and lumbar spine with chronic pain. 15. Gastroesophageal reflux disease. 16. 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   DISPOSITION: Patient is being discharged to an assisted living facility.  CODE STATUS: DO NOT RESUSCITATE.   DIET: Low sodium, ADA diet.   ACTIVITY: As tolerated.   DISCHARGE INSTRUCTIONS: Patient's hospice is also going to be resumed. Follow up with primary care physician, Dr. Gavin PottersGrandis, in 1 to 2 weeks after discharge.   DISCHARGE MEDICATIONS:  1. Oxygen 2 liters via nasal cannula.  2. Tylenol 650 mg every six hours p.r.n.  3. Spiriva 18 mcg inhaled daily.  4. Levaquin 750 mg daily for three days. 5. Prednisone taper as prescribed.  6. Wellbutrin 300 mg daily.  7. Trazodone 450 mg daily at bedtime.  8. Aspirin 325 mg daily.  9. Vigamox 0.5 mg 1 drop to both eyes t.i.d.  10. Triamcinolone 2 sprays each nostril daily.  11. Lipitor 10 mg at bedtime.  12. Ativan 1 mg q.i.d.  13. Symbicort 2 puffs b.i.d.  14. Tylenol p.r.n.  15. Flexeril 10 mg t.i.d. p.r.n.  16. Nexium 40 mg daily.  17. DuoNebs q.6 hours p.r.n.  18. Cardizem CD 300 mg daily.  19. MS Contin 15 mg b.i.d.   LABORATORY, DIAGNOSTIC AND RADIOLOGICAL DATA: Blood cultures no growth so far. Sputum culture poor quality. Normal  CBC other than anemia of chronic disease. Hemoglobin 10.2 to 10.8. Low potassium which was supplemented. Steroid-induced hyperglycemia. Rest of complete metabolic panel was normal. Influenza A and B were negative. Chest x-ray showed chronic obstructive pulmonary disease. No acute cardiopulmonary disease.   HOSPITAL COURSE: Patient is a 74 year old female with end-stage chronic obstructive pulmonary disease on home O2 and hospice, hypertension, hyperlipidemia, coronary artery disease who presented with shortness of breath. She was found to have acute on chronic respiratory failure due to chronic obstructive pulmonary disease exacerbation and bronchitis. She was started on empiric Levaquin, steroids, Spiriva, nebulizers, Symbicort with good improvement in patient's symptom. Her chest x-ray showed no infiltrates. Her influenza A and B was also negative. On presentation she also had possible sepsis with fever and tachycardia which resolved during the hospitalization. Her blood and urine cultures have been negative so far. The rest of patient's medical problems remained stable. She did complain of some diarrhea initially. Her stool softeners were discontinued and she has not had any diarrhea thereafter. Patient used to live at home with her sister, however, she desired placement therefore an assisted living facility was arranged for the patient where she is being discharged in a stable condition.   CODE STATUS: She is a DO NOT RESUSCITATE.   TIME SPENT: 45 minutes.  ____________________________ Darrick MeigsSangeeta Ily Denno, MD sp:cms D: 05/24/2011 16:37:38 ET T: 05/25/2011 10:19:08 ET JOB#: [id number]  cc: Darrick MeigsSangeeta Jakyren Fluegge, MD, <Dictator> Letitia CaulHeidi M. Grandis, MD Darrick MeigsSANGEETA Tonie Elsey MD ELECTRONICALLY SIGNED 05/25/2011 16:27

## 2014-08-31 NOTE — H&P (Signed)
PATIENT NAME:  Grace Reyes, Grace Reyes MR#:  [id number] DATE OF BIRTH:  May 24, 1940  DATE OF ADMISSION:  06/24/2011  REFERRING PHYSICIAN: Maricela BoLuna Ragsdale, MD  PRIMARY CARE PHYSICIAN: Rolm GalaHeidi Grandis, MD  PRESENTING COMPLAINT: Confusion, paranoia, and frequent falls.   HISTORY OF PRESENT ILLNESS: [name] is a 74 year old woman with history of endstage chronic obstructive pulmonary disease, on chronic 3 liters of O2, chronic pain and spinal stenosis and degenerative joint disease and disk disease, on chronic morphine, history of anxiety, on chronic Ativan, hypertension, hyperlipidemia, and coronary artery disease who represents with her daughters in the room. Apparently since Sunday of this week, she has had more profound confusion and today has had increased paranoia with reports of people trying to hurt her, not taking her medications. Her daughter reports that this is not typical of her. They also report that since she has been discharged she has had increase in falls and has not had any ambulatory effort. Prior to her hospital admission where she was living with her sister, she was ambulating 15 to 20 feet with a walker. Now since she has been discharged from the hospital, to the assisted living facility, she has not ambulated. She continues to complain of pain. Her confusion seems to be waxing and waning. During the interview, the patient is actually lucid and answering questions appropriately. She is even aware of her transient delirious state. She denies any chest pain. She has chronic shortness of breath. No worsening lower extremity edema.   PAST MEDICAL HISTORY:  1. Admitted 01/10 to 05/24/2011 for management of acute on chronic respiratory failure and chronic obstructive pulmonary disease exacerbation with acute bronchitis and probable sepsis.  2. Hypertension.  3. Hyperlipidemia.  4. Coronary artery disease.  5. Diastolic dysfunction.  6. Degenerative joint disease and degenerative disk disease  of cervical and lumbar spine and chronic pain.  7. Former tobacco abuse.  8. Depression.  9. Gastroesophageal reflux disease.  10. Chronic sinus tachycardia.  11. 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  12. Chronic history of sinusitis and conjunctivitis with MRSA, followed by ENT.  13. End-stage chronic obstructive pulmonary disease, on chronic oxygen of 3 liters, and followed by hospice.  14. Anxiety.  15. Anemia of chronic disease.   PAST SURGICAL HISTORY:  1. Hysterectomy.  2. Sinus surgery. 3. Appendectomy.  4. Cataract surgery bilaterally.   ALLERGIES: IV dye.   MEDICATIONS:  1. Wellbutrin XL 300 mg daily.  2. Aspirin 325 mg daily.  3. Vigamox 0.5% ophthalmic solution one drop to both eyes three times daily. 4. Triamcinolone 55 mcg/inhaled nasal spray two sprays to each naris daily.  5. Atorvastatin 10 mg daily.  6. Cyclobenzaprine 10 mg three times daily. 7. MS Contin 15 mg twice a day.  8. Ativan 1 mg four times daily.  9. Nexium 40 mg daily.  10. Trazodone 150 mg 3 tablets at bedtime.  11. Symbicort 160 mcg/4.5 mcg inhaler 2 puffs twice a day. 12. Cardizem CD 300 mg daily.  13. Senna Plus tablet 2 tablets twice a day.  14. Sorbitol 30 mL twice a day.  15. Liquid morphine 10 mg as needed.  16. ProAir HFA 90 mcg inhaler aerosol 2 puffs every four hours as needed.  17. Prochlorperazine 10 mg every four hours as needed.  18. Albuterol ipratropium nebulizer every four hours as needed.  19. Tylenol 1000 mg every 4 to 6 hours as needed.  20. Spiriva daily.  21. The patient appears to be on a  prednisone taper. It looks like she is still on 5 mg, between 02/11 and 07/01/2011.   FAMILY HISTORY: History of depression and anxiety, myocardial infarction, CVA, and emphysema, but they were all smokers.   SOCIAL HISTORY: She used to smoke 2 packs per day from age 32 but quit in 2008. No alcohol or drug use. She lives at [facility] assisted living facility. Prior to  that, she was in [place] with her sister. She is followed by hospice.   REVIEW OF SYSTEMS: CONSTITUTIONAL: Denies any fevers, nausea, or vomiting. EYES: History of cataracts. ENT: No epistaxis. RESPIRATORY: Chronic respiratory failure and chronic obstructive pulmonary disease, on chronic oxygen. CARDIOVASCULAR: No chest pain, palpitations, or syncope. She reports some presyncope. GASTROINTESTINAL: No nausea or vomiting. She has typically constipation, on a bowel regimen. GU: No dysuria or hematuria. ENDOCRINE: No heat or cold intolerance. HEME: She has multiple bruising from her falls. SKIN: No ulcers. NEUROLOGIC: No dysarthria or aphasia. She has symmetrical strength. PSYCH: Denies any suicidal ideation.  PHYSICAL EXAMINATION:   VITAL SIGNS: Temperature is 99.1, pulse 130, respiratory rate 24, blood pressure 164/90, and saturating 100% on 3 liters. Current pulse is in the 90s.   GENERAL: Lying in bed, in no apparent distress.   HEENT: Normocephalic, atraumatic. Her pupils show scarring but are responsive, anicteric. Nasal cannula in place. She has moist mucous membranes.   NECK: Soft and supple. No JVP.   CARDIOVASCULAR: Mildly tachy. No murmurs, rubs, or gallops.   LUNGS: Coarse breath sounds diffusely. No wheezing.   ABDOMEN: Soft. Positive bowel sounds. No mass appreciated.   EXTREMITIES: Trace edema bilaterally. Dorsal pedis pulses intact.   MUSCULOSKELETAL: Pain on palpation of her sacrum. No spinal tenderness. No joint effusion.   SKIN: Scant ecchymosis.   NEUROLOGIC: Symmetrical strength. No focal deficits.  PERTINENT LABS/STUDIES: Urine drug screen positive for TCA, amphetamines, MDMA, and opiate.   Urinalysis with specific gravity of 1.012, pH 5, protein 30 mg/dL, RBC less than 1 per high-power field, and WBC 1 per high-power field.   Chest x-ray with discoid atelectasis versus possible infiltrate, in the region of the lingula.  CT of head without contrast shows  involutional changes without evidence of acute abnormalities.   WBC 9.9, hemoglobin 10.9, hematocrit 32.6, platelet 207, MCV 94. Glucose 113, BUN 10, creatinine 0.92, sodium 140, potassium 4.5, chloride 102, carbon dioxide 25, calcium 8.9, total bilirubin 0.7, alkaline phosphatase 64, ALT 85, AST 53, total protein 6.8. CK 72, MB 1.6, and troponin less than 0.02. Alcohol level less than 3. Ammonia level less than 25. Venous CO2 45.   EKG with sinus tachy of 107. No ST changes.   ASSESSMENT AND PLAN: Ms. Radebaugh is a 74 year old woman with history of degenerative disk disease, spinal stenosis and chronic pain on narcotics, history of anxiety and chronic benzodiazepine, hypertension, hyperlipidemia, coronary artery disease, and end-stage chronic obstructive pulmonary disease on chronic oxygen presenting with poor ambulatory effort, frequent falls, altered mental status, and paranoia.  1. Altered mental status/delirium: The patient has waxing and waning mentation. Her paranoia has began as of today. Concern for likely medication induced especially with the waxing and waning and the patient has had narcotic regimen initiated from her last admission with p.r.n. narcotic on board plus/minus steroid-induced psychosis. She appears to be on 5 mg currently of the steroids. We will stop that for now and continue to follow. Her ammonia and alcohol level are normal. Her CT of the head is unrevealing. There appears to be  no source of infection. We will send a TSH, folate, B12, magnesium, and RPR level. We will decrease her MS Contin, as well as her trazodone. We will use IV morphine as needed to help with her withdrawal pain. She is also on standing dose of Ativan, Wellbutrin, and Flexeril. Medication adjustments per psych. For now we will restart at her usual dosing. I discussed with the family and the patient other alternatives for pain control. They are receptive. We will evaluate with PT as the patient has not been  ambulatory. I suspect that there is some component of deconditioning.  2. Sinus tachycardia with history of tachycardia, questionable also in light of her anxiety and paranoia. Currently heart rate is stable in the 90s.  3. End-stage chronic obstructive pulmonary disease, appears to be stable: Her chest x-ray likely is more atelectasis given no white count fever or worsening symptoms from a respiratory standpoint. Restart her Spiriva, Symbicort, and SVNs as needed.  4. Acute on chronic pain status post falls: Plan per regimen as above. We will x-ray her sacrum and provide donut for sacral pain.  5. Prophylaxis with Lovenox, aspirin, and Nexium.   The plan was discussed with her daughters, who are her POAs.   TIME SPENT: Approximately 50 minutes was spent on patient care. ____________________________ Reuel Derby, MD ap:slb D: 06/24/2011 01:30:55 ET T: 06/24/2011 09:06:34 ET JOB#: [id number]  cc: Pearlean Brownie Udell Mazzocco, MD, <Dictator> Letitia Caul, MD Reuel Derby MD ELECTRONICALLY SIGNED 07/05/2011 0:43

## 2014-08-31 NOTE — Consult Note (Signed)
Chief Complaint:   Subjective/Chief Complaint Still c/o pain on swallowing but now mostly over right chest area and not retrosternal. Remains SOB on [place].   VITAL SIGNS/ANCILLARY NOTES: **Vital Signs.:   04-Mar-13 16:16   Vital Signs Type Routine   Temperature Temperature (F) 97.8   Celsius 36.5   Pulse Pulse 82   Respirations Respirations 18   Systolic BP Systolic BP 546   Diastolic BP (mmHg) Diastolic BP (mmHg) 72   Mean BP 90   BP Source Dinamap   Pulse Ox % Pulse Ox % 99   Routine Chem:  04-Mar-13 04:27    Glucose, Serum 137   BUN 21   Creatinine (comp) 1.54   Sodium, Serum 144   Potassium, Serum 4.0   Chloride, Serum 102   CO2, Serum 31   Calcium (Total), Serum 8.5   Anion Gap 11   Osmolality (calc) 292   eGFR (African American) 43   eGFR (Non-African American) 36  Routine Hem:  04-Mar-13 04:27    WBC (CBC) 9.7   RBC (CBC) 2.78   Hemoglobin (CBC) 8.5   Hematocrit (CBC) 25.9   Platelet Count (CBC) 306   MCV 93   MCH 30.5   MCHC 32.8   RDW 16.3   Neutrophil % 83.7   Lymphocyte % 11.0   Monocyte % 5.2   Eosinophil % 0.0   Basophil % 0.1   Neutrophil # 8.1   Lymphocyte # 1.1   Monocyte # 0.5   Eosinophil # 0.0   Basophil # 0.0  Routine Chem:  04-Mar-13 04:27    Phosphorus, Serum 3.0  Hepatic:  04-Mar-13 04:27    Albumin, Serum 2.3   Assessment/Plan:  Assessment/Plan:   Assessment Odynophagis most likely secondary to candida esophagitis. Patient was started on Diflucan yesterday. Anemia.    Plan Will observe for another 24 hours. if no significant improvement, will proceed with an EGD Wednesday as long as her respiratory status is acceptable. Colonoscopy later for evaluation of anemia.   Electronic Signatures: Jill Side (MD)  (Signed 04-Mar-13 18:58)  Authored: Chief Complaint, VITAL SIGNS/ANCILLARY NOTES, Lab Results, Assessment/Plan   Last Updated: 04-Mar-13 18:58 by Jill Side (MD)

## 2014-08-31 NOTE — Consult Note (Signed)
Chief Complaint:   Subjective/Chief Complaint Overall same. Dr. Allena KatzPatel has noticed oro-pharyngeal candidiasis which is probably the cause of her odynophagia as well. Recommend Diflucan and EGD if no improvement in 48-72 hours. Patient will need an elective EGD and colonoscopy for her anemia once her respiratory status is improved.   VITAL SIGNS/ANCILLARY NOTES: **Vital Signs.:   03-Mar-13 07:46   Temperature Temperature (F) 97.9   Celsius 36.6   Temperature Source oral   Pulse Pulse 84   Respirations Respirations 20   Systolic BP Systolic BP 138   Diastolic BP (mmHg) Diastolic BP (mmHg) 79   Mean BP 98   BP Source Dinamap   Pulse Ox % Pulse Ox % 99   Pulse Ox Activity Level  At rest   Oxygen Delivery 3L; Nasal Cannula   Electronic Signatures: Lurline DelIftikhar, Herberto Ledwell (MD)  (Signed 03-Mar-13 10:25)  Authored: Chief Complaint, VITAL SIGNS/ANCILLARY NOTES   Last Updated: 03-Mar-13 10:25 by Lurline DelIftikhar, Andrius Andrepont (MD)

## 2014-08-31 NOTE — Consult Note (Signed)
PATIENT NAME:  Grace Reyes, Grace Reyes MR#:  [id number] DATE OF BIRTH:  Jun 17, 1940  DATE OF CONSULTATION:  07/08/2011  REFERRING PHYSICIAN:  Dr. Larena Glassman  CONSULTING PHYSICIAN:  Lurline Del, MD  PRIMARY CARE PHYSICIAN: Dr. Rolm Gala  REASON FOR CONSULTATION: Progressive anemia, dysphagia, nausea, vomiting and diarrhea.   HISTORY OF PRESENT ILLNESS: 74 year old Caucasian female with history of chronic obstructive pulmonary disease, chronic respiratory failure. She is DO NOT RESUSCITATE on hospice care. She was admitted to the hospital three days ago with altered mental status most likely secondary to respiratory failure. She also had low-grade fever and productive cough. Patient was placed on BiPAP 48 hours ago. She was taken off BiPAP and she is now on nasal cannula. Patient has progressive anemia. Her most recent hemoglobin is about 8.6 and apparently her hemoglobin has been dropping very slowly over a period of time. She denies any nausea, vomiting, melena, hematemesis or bright red blood per rectum. In the last few days patient has experienced some nausea and vomiting as well as diarrhea but according to her all those symptoms have now resolved. Her main GI symptom now is dysphagia and odynophagia. She points toward the epigastric and right upper quadrant areas as area of discomfort whenever she tries to eat or drink something. According to her she has not been able to eat or drink much because of the discomfort. She has never had an upper GI endoscopy and I do not believe she has never had a colonoscopy either. Iron studies are not consistent with iron deficiency anemia and B12 and folate levels were normal.   PAST MEDICAL HISTORY:  1. History of altered mental status and recent admissions secondary to that. 2. History of chronic obstructive pulmonary disease. 3. Chronic respiratory failure. 4. Hypertension. 5. Hyperlipidemia. 6. Coronary artery disease,  7. Diastolic  dysfunction. 8. History of depression. 9. Gastroesophageal reflux disease. 10. History of sinus tachycardia.  11. Chronic anemia according to the notes.   PAST SURGICAL HISTORY:  1. Appendectomy. 2. Hysterectomy. 3. Cataract surgery.   MEDICATIONS AT HOME:  1. Oxygen. 2. Spiriva.  3. Wellbutrin. 4. Trazodone. 5. Aspirin. 6. Triamcinolone. 7. Lipitor. 8. Symbicort. 9. Cardizem. 10. MS Contin. 11. Flexeril. 12. Nexium.   ALLERGIES: Intravenous pyelogram dye.   FAMILY HISTORY: Unremarkable.   SOCIAL HISTORY: She is a smoker.   REVIEW OF SYSTEMS: Positive for being short of breath as well as odynophagia and dysphagia as described above. She did have nausea, vomiting, and diarrhea, but she denies those symptoms any further.   PHYSICAL EXAMINATION:  GENERAL/VITAL SIGNS: Somewhat obese female. She appears to be in mild to moderate respiratory distress breathing around 20 to 22 per minute, blood pressure 127/77, temperature 98.   SKIN: Somewhat pale.   NECK: Veins are flat.   LUNGS: Bilateral crackles and wheezing.   CARDIOVASCULAR: Regular rate and rhythm. No gallops or murmurs were heard.   ABDOMEN: Quite soft and benign. Bowel sounds are positive. Nontender, nondistended. No rebound or guarding was noted.   NEUROLOGIC: Appears to be unremarkable.   LABORATORY, DIAGNOSTIC AND RADIOLOGICAL DATA: Labs on admission: Hemoglobin 10.3, white cell count normal at 8.6, platelet count 396. Her hemoglobin is now 8.2. Serum ammonia is less than 25. BNP 327. LDH 267, ferritin 377, iron saturation 35. Folic acid is normal. Serum creatinine 1.55. Vitamin B12 is normal at 622, haptoglobin is high at 410.   ASSESSMENT AND PLAN: Patient with:  1. Nausea, vomiting, and diarrhea that seemed to have  resolved and most likely was viral in origin. Withdrawal from her medications could be another reason for her nausea, vomiting, and diarrhea.  2. Dysphagia and odynophagia. This appears to be  quite significant. Patient has never had an upper GI endoscopy and I believe upper GI evaluation will be required. Patient is just recovering from this acute respiratory failure due to chronic obstructive pulmonary disease exacerbation. Plan has been discussed with Dr. Lafayette DragonFirozvi. Will continue to monitor and when her respiratory status is better will proceed with an upper GI endoscopy for further evaluation.  3. Anemia. This appears to be anemia of chronic disease although her hemoglobin and hematocrit is slowly trending down over a period of time. LDH was somewhat elevated causing some concerns about possible hemolysis. Chronic GI blood loss is another consideration as patient has never had an EGD or a colonoscopy. Patient is not fit for a colonoscopy at this time but again as mentioned above will proceed with an upper GI endoscopy first and then give consideration to a colonoscopy when she is clinically more stable. Follow hemoglobin and hematocrit and transfuse if needed. Continue proton pump inhibitor. Will continue to follow her closely. Plan has been discussed with Dr. Lafayette DragonFirozvi.   ____________________________ Lurline DelShaukat Nashayla Telleria, MD si:cms D: 07/08/2011 16:34:27 ET T: 07/08/2011 17:25:54 ET  JOB#: [id number] cc: Lurline DelShaukat Myan Locatelli, MD, <Dictator> Lurline DelSHAUKAT Creighton Longley MD ELECTRONICALLY SIGNED 07/09/2011 15:00

## 2014-08-31 NOTE — Consult Note (Signed)
Chief Complaint:   Subjective/Chief Complaint Feels better with less discomfort on swallowing. Still with SOB and cough with green sputum this morning.   VITAL SIGNS/ANCILLARY NOTES: **Vital Signs.:   02-Mar-13 12:01   Vital Signs Type Routine   Temperature Temperature (F) 98.1   Celsius 36.7   Temperature Source tympanic   Pulse Pulse 71   Pulse source per Dinamap   Respirations Respirations 18   Systolic BP Systolic BP 958   Diastolic BP (mmHg) Diastolic BP (mmHg) 69   Mean BP 86   BP Source Dinamap   Pulse Ox % Pulse Ox % 97   Pulse Ox Activity Level  At rest   Oxygen Delivery 3L   Routine Chem:  02-Mar-13 02:55    Glucose, Serum 112   BUN 22   Creatinine (comp) 1.52   Sodium, Serum 141   Potassium, Serum 4.2   Chloride, Serum 99   CO2, Serum 32   Calcium (Total), Serum 8.4   Anion Gap 10   Osmolality (calc) 285   eGFR (African American) 44   eGFR (Non-African American) 36  Routine Hem:  02-Mar-13 02:55    Hemoglobin (CBC) 8.5   Assessment/Plan:  Assessment/Plan:   Assessment Dysphagia and progressiveanemia. H and H is stable. SOB, ? bronchitis or pneumonia.    Plan Continue PPI. EGD when respiratory status more stable, probably Monday. Will follow.   Electronic Signatures: Jill Side (MD)  (Signed 02-Mar-13 13:45)  Authored: Chief Complaint, VITAL SIGNS/ANCILLARY NOTES, Lab Results, Assessment/Plan   Last Updated: 02-Mar-13 13:45 by Jill Side (MD)

## 2014-08-31 NOTE — Consult Note (Signed)
Brief Consult Note: Diagnosis: delirium - resolved.   Patient was seen by consultant.   Consult note dictated.   Comments: Psychiatry: Patient seen for altered mental status. Patient was alert and oriented and conversant. Mood stable. No sign of acute delirium. Acute mental states seems to be improved. Patient says she feels back to baseline. I might suggest not restarting her narcotics if possible. Also would not restart flexeril. Full note dictated.  Electronic Signatures: Audery Amellapacs, Carter Kaman T (MD)  (Signed [phone]-Feb-13 12:25)  Authored: Brief Consult Note   Last Updated: 28-Feb-13 12:25 by Audery Amellapacs, Markale Birdsell T (MD)

## 2014-08-31 NOTE — Discharge Summary (Signed)
PATIENT NAME:  Grace Reyes, [name] MR#:  [id number] DATE OF BIRTH:  [birth year]  DATE OF ADMISSION:  06/25/2011 DATE OF DISCHARGE:  06/28/2011  DISPOSITION: To [facility] assisted living facility.   CODE STATUS:  DO NOT RESUSCITATE.     DISCHARGE DIAGNOSES:  1. Altered mental status secondary to medication-induced with benzodiazepines and narcotics.  2. History of falls.  3. The patient's altered mental status is resolved. That is likely metabolic encephalopathy.  4. Depression.  5. History of hypertension.  6. History of chronic back pain.  7. 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  8. History of methicillin-resistant Staphylococcus aureus in the left eye sinus and conjunctivitis.  9. 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  10. The patient has a history of anxiety.   CONSULTATIONS: Hospice and also Psychiatric consult.   DIET: Low sodium diet.   ACTIVITY: As tolerated.   FOLLOWUP: The patient was discharged to [facility] assisted living with Hospice and followup with Dr. Gavin PottersGrandis in 1 to 2 weeks.   DISCHARGE MEDICATIONS:  1. Oxygen 2 liters by nasal cannula. 2. Spiriva 18 mcg inhalation daily.  3. Wellbutrin 300 mg p.o. daily.  4. Trazodone 450 mg at bedtime.  5. Aspirin 325 mg p.o. daily.  6. Vigamox eye drops in both eyes 0.5 mg t.i.d.  7. Triamcinolone 2 sprays each nostril daily. 8. Lipitor 10 mg at bedtime. 9. Symbicort 2 puffs b.i.d.  10. Flexeril 10 mg p.o. t.i.d.  11. MS Contin 50 mg p.o. b.i.d.  12. Cardizem CD 300 mg p.o. daily.  13. DuoNebs every 6 hours p.r.n.  14. Nexium 40 mg p.o. daily. 15. Levaquin 500 mg p.o. daily for 5 days.   NOTE: Stop Ativan because of altered mental status and falls.   HOSPITAL LABORATORY, DIAGNOSTIC AND RADIOLOGICAL DATA:  White count on 06/23/2011 was 9.9, hemoglobin 10.9, hematocrit 32.6, platelets 207.  Electrolytes: Sodium 140,  potassium 4.5, chloride 102, bicarbonate 25, BUN 10, creatinine 0.92, glucose 113.  Liver functions within normal limits.  Troponin less than 0.02. CK total 72, CPK-MB 1.6.  CT of the head done at admission showed involutional changes without evidence of acute abnormality.  Chest x-ray on admission showed discoid atelectasis as well as infiltrate in the region of the lingula which is compared with chest x-ray on January 10th.  The patient has bacteria in the urine which urine cultures showed negative.  The patient's urine toxicology showed positive for tricyclic antidepressants, amphetamines, MDMA and opiates.  The patient vitamin B12 level was 448, folic acid 11.2. RPR titers are nonreactive.  The patient's magnesium is 1.4.  The patient's potassium was 3.1 on February 16th, which was replaced.   DISCHARGE VITAL SIGNS: This morning, temperature is 98.7, pulse is 108, respirations 22, blood pressure 120/70. The patient's saturations are 96% on 3 liters.   HOSPITAL COURSE: The patient is a 74 year old female patient who was here recently, came in because of confusion, paranoia and frequent falls. Look in the History and Physical for full details. The patient takes morphine for her back pain, along with Ativan, and was brought in by the daughter because of confusion and paranoia. The patient had been trying to hurt herself and not taking medications. The patient also noted to have falls at the nursing home with poor ambulatory effort. According to the note, prior to the hospitalization in January she was walking about 15 to 20 feet with a  walker. The patient has a lot of confusion at the nursing home with waxing and waning. on admission. The patient admitted for altered mental status, concern for medication-induced with narcotics. Also, she was on prednisone dose taper when she was discharged last time, so prednisone was stopped. CT of the head did not show any acute changes. The patient's magnesium was  low, which was replaced. Her B12 level, and folate, and RPR were negative. The patient's Ativan also is decreased, dose is 1 mg t.i.d. She was getting q.i.d., so it is decreased to t.i.d. Psychiatric consult was obtained with Dr. Jennet Maduro for her paranoia, and the was patient advised to continue on her Wellbutrin and trazodone and decrease the dose of Ativan as it will cloud the consciousness; so, Ativan dose has been decreased to 1 mg t.i.d., but I suggested if she can be off Ativan that will be good, if not she can take 1 mg b.i.d. and see how she tolerates because she is on MS Contin along with other medications.   Tachycardia: The tachycardia is  likely secondary to her anxiety and paranoia, which have resolved.   End-stage chronic obstructive pulmonary disease: The patient has no wheezing, has a low-grade fever since yesterday, with discoid atelectasis on admission even though normal white count. She may be just a candidate for antibiotics, so she can continue Spiriva, Symbicort nebulizers and Levaquin 500 mg p.o. daily for 5 days.   Chronic pain with history of falls: The patient had back pain. She is on MS Contin, and Hospice is on board. She also has Roxanol as needed. The patient will resume Hospice care at the assisted-living facility and continue MS Contin.   The patient has history of hypertension. It is controlled. She can continue her Cardizem.   The patient has history of MRSA infection before and eye infection. She is on triamcinolone and Vigamox eye drops. She can continue that.   The patient has been started on low-dose Haldol for her agitation and visual hallucinations when she came in, so she can get a small dose of Haldol if she gets agitated or hallucinating. According to the Psychiatric note, avoid benzodiazepines and anticholinergics, if possible.   The patient's condition is stable at this time.   TIME SPENT ON DISCHARGE PREPARATION: More than 30 minutes.     CODE  STATUS: DO NOT RESUSCITATE   ____________________________ Katha Hamming, MD sk:cbb D: 06/28/2011 10:56:53 ET T: 06/28/2011 11:29:03 ET JOB#: [id number]  cc: Katha Hamming, MD, <Dictator> Letitia Caul, MD Katha Hamming MD ELECTRONICALLY SIGNED 06/28/2011 13:28

## 2014-08-31 NOTE — Discharge Summary (Signed)
PATIENT NAME:  Grace Reyes, Grace Reyes MR#:  [id number] DATE OF BIRTH:  1940-11-01  DATE OF ADMISSION:  07/05/2011 DATE OF DISCHARGE:  07/13/2011  ADMITTING DIAGNOSIS: Altered mental status, cough and low grade fevers.   DISCHARGE DIAGNOSES:  1. Altered mental status, felt to be due to metabolic encephalopathy as a result of medications. The patient's mental status is currently back to baseline. She was on high-dose trazodone which has been discontinued. Her anti-anxiety medication dosage has also been reduced.  2. Fever, coughing, likely due to acute bronchitis. Initially treated with broad-spectrum antibiotics with her chest x-ray being negative. These were discontinued. The patient now is starting to have productive cough again, so is restarted on antibiotics and prednisone taper.  3. Nausea and vomiting with diarrhea, likely due to viral gastroenteritis and/or due to opioid withdrawal, now resolved. Stool studies for diarrhea negative.  4. Dysphagia with right-sided chest pain felt to be due to possible oropharyngeal candidiasis. The patient was treated with nystatin, seen by gastroenterology, was planned to have an esophagogastroduodenoscopy, but anesthesiology did not feel comfortable. He was felt high risk, therefore, it was canceled.  5. Acute on chronic respiratory failure.  6. Chronic obstructive pulmonary disease, oxygen dependent.  7. Chronic respiratory failure.  8. Hypertension.  9. Hyperlipidemia.  10. Coronary artery disease.  11. History of diastolic dysfunction.  12. Chronic back pain.  13. History of depression.  14. Gastroesophageal reflux disease.  15. History of chronic sinus tachycardia.  16. 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  17. Chronic sinusitis.  18. History of anemia of chronic disease.  19. History of anxiety.  20. Status post appendectomy.  21. Status post hysterectomy.  22. Status post sinus surgery.   23. Status post bilateral cataract surgery.  24. Acute renal failure. The patient was seen by nephrology. Renal function continues to be elevated, but is stable.  25. Anemia due to anemia of chronic disease.   PERTINENT LABORATORY AND EVALUATIONS: Chest x-ray on admission showed no consolidation. No effusion. BNP was 327, BUN 11, creatinine 0.7, sodium 139, potassium 3.6. Troponin was less than 0.02. CBC showed WBC of 8,000. Hemoglobin 10. Urinalysis was negative. ABG showed pH 7.45, pCO2 of 35, pO2 of 110, FiO2 of 30%. Bilateral kidney ultrasound showed unremarkable renal ultrasound.   CONSULTANTS:  1. Dr. Wynelle Link.  2. Dr. Niel Hummer.  3. Palliative care, Dr. Harvie Junior.   HOSPITAL COURSE: Please see history and physical done by the admitting physician. The patient is a 74 year old white female with advanced chronic obstructive pulmonary disease, has chronic respiratory failure, who is followed by hospice at the facility that she stays at, who was discharged recently when she was admitted with altered mental status secondary to benzodiazepines and narcotics. This time brought back with again altered mental status, lethargic and having cough and low-grade fevers with shortness of breath. Required BiPAP placement initially for this.  1. Acute encephalopathy. The patient's sedating medications were held with these treatments. Her mental status returned back to baseline. It was felt to be due to medications.  2. Cough and fever. Initially she was thought to have acute bronchitis. Due to her respiratory distress, she was placed on Zosyn and vancomycin which were subsequently discontinued. Her respiratory status has remained stable and she continues to be on 3 liters. However, since yesterday she has had an increase in productive cough. Therefore, she will be started back on treatment for bronchitis with Levaquin and prednisone taper.  3. Acute  renal failure. The patient was seen by nephrology. This may be her  new baseline. Her renal function is currently stable at current levels.  4. The patient also started complaining of significant amount of pain in the right side of her chest especially with eating, so she was followed by gastroenterology. She was also noted to have oral thrush. This was treated. Despite this treatment she continued to have the symptoms of dysphagia. Therefore, gastroenterology decided to do an endoscopy today. Anesthesiology evaluated her today before he procedure and felt she was too high risk for any anesthesia, so the procedure was canceled. Dr. Niel HummerIftikhar stated that he will follow her up as an outpatient for further evaluations. At this time, the patient is stable for discharge with continued treatment.   PROGNOSIS: Her prognosis is overall very poor.   DISCHARGE MEDICATIONS:  1. Wellbutrin XL 300 daily.  2. Aspirin 325 mg p.o. daily.  3. Vigamox 0.5% one drop into both eyes three times daily.  4. Triamcinolone 55 mcg intranasally two sprays in each nostril daily.   5. Atorvastatin 10 daily.  6. Nexium 40 daily.  7. Symbicort 160/4.5 mcg 2 puffs b.i.d.  8. Cardizem CD 300, one tab p.o. daily.  9. Senna 2 tabs 2 times daily. 10. Sorbitol 70% 30 milliliters b.i.d.  11. ProAir 2 puffs q.4 p.r.n. for shortness of breath. 12. Prochlorperazine 10 mg 1 tab p.o. q.4 p.r.n.  13. Tylenol caps extra strength 1 to 2 tabs q.4 to q.6 p.r.n.  14. Spiriva 18 mcg one inhalation daily.  15. Albuterol Atrovent nebulizers every four while awake.  16. Ativan 0.5 mg p.o. q.8 p.r.n. anxiety.  17. Levaquin 500 p.o. daily x4 days. 18. Prednisone taper 40 mg p.o. daily x2 days, 20 mg x2 days, 10 mg x2 days. 19. Nystatin swish and swallow 15 mL p.o. q.i.d. x5 days.  20. Fluconazole 100 mg p.o. daily x4 days. 21. Mucinex 600 p.o. every 12 hours. 22. Vicodin 1 tab p.o. q.8 p.r.n. pain.   HOME OXYGEN: Yes, at 3 liters.   DIET: Low sodium.   ACTIVITY: As tolerated.   REFERRAL: Hospice.  Continue follow-up with Dr. Rolm GalaHeidi Grandis in 1 to 2 weeks.     TIME SPENT: 35 minutes.    ____________________________ Lacie ScottsShreyang H. Allena KatzPatel, MD shp:ap D: 07/13/2011 14:50:08 ET T: 07/14/2011 10:13:03 ET JOB#: [id number]  cc: Carlen Fils H. Allena KatzPatel, MD, <Dictator> Letitia CaulHeidi M. Grandis, MD Charise CarwinSHREYANG H Pegah Segel MD ELECTRONICALLY SIGNED 07/15/2011 7:55

## 2014-08-31 NOTE — Consult Note (Signed)
PATIENT NAME:  Grace Reyes, Grace Reyes [id number] OF BIRTH:  04/08/1941 OF ADMISSION:  02/15/2013OF CONSULTATION:  06/24/2011  REFERRING PHYSICIAN:  Alounthith Phichith, MDPHYSICIAN: Kristine Linea, MD DATA:  Grace Reyes is a 74 year old woman with history of depression. COMPLAINT: "It is from medications." OF PRESENT ILLNESS: Grace Reyes has a long history of depression. Grace Reyes has been stable on a combination of Wellbutrin and Trazodone. Grace Reyes was admitted to medical floor for altered mental status.  On Sunday, Grace Reyes became confused and paranoid believing that people are trying to hurt her and refused medications. The patient now believes that thus was a consequence of recent medication adjustment, most likely narcotics.  Of note, Grace Reyes was positive for stimulants and MDMA on admission.  PYCHIATRIC HISTORY: There is one psychiatric hospitalization 20 years ago. Grace Reyes no longer sees a psychiatrist and her medications are prescribed by her PCP.   PSYCHIATRIC HISTORY: None reported. MEDICAL HISTORY:  1. Chronic respiratory failure and chronic obstructive pulmonary disease exacerbation with acute bronchitis and probable sepsis.  2. Hypertension.  Hyperlipidemia.  Coronary artery disease.  Diastolic dysfunction.  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  Former tobacco abuse.  Depression.  Gastroesophageal reflux disease.  Chronic sinus tachycardia.  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  Chronic history of sinusitis and conjunctivitis with MRSA, followed by ENT.  End-stage chronic obstructive pulmonary disease, on chronic oxygen of 3 liters, and followed by hospice.  Anemia of chronic disease.   ALLERGIES: IV dye.  ON ADMISION:  1. Wellbutrin XL 300 mg daily.  2. Aspirin 325 mg daily.  Vigamox 0.5% ophthalmic solution one drop to both eyes three times daily. Triamcinolone 55 mcg/inhaled nasal spray two sprays to each naris daily.  Atorvastatin 10 mg  daily.  Cyclobenzaprine 10 mg three times daily. MS Contin 15 mg twice a day.  Ativan 1 mg four times daily.  Nexium 40 mg daily.  Trazodone 150 mg 3 tablets at bedtime.  Symbicort 160 mcg/4.5 mcg inhaler 2 puffs twice a day. Cardizem CD 300 mg daily.  Senna Plus tablet 2 tablets twice a day.  Sorbitol 30 mL twice a day.  Liquid morphine 10 mg as needed.  ProAir HFA 90 mcg inhaler aerosol 2 puffs every four hours as needed.  Prochlorperazine 10 mg every four hours as needed.  Albuterol ipratropium nebulizer every four hours as needed.  Tylenol 1000 mg every 4 to 6 hours as needed.  Spiriva daily.  The patient appears to be on a prednisone taper. It looks like Grace Reyes is still on 5 mg, between 02/11 and 07/01/2011.  HISTORY: Grace Reyes recently relocated to [facility] assisted living facility. Grace Reyes lives there with her sister. Grace Reyes is followed by hospice. Her daughter and son are involved in her care. Grace Reyes is a smoker. OF SYSTEMS: CONSTITUTIONAL: No fevers, chills or weight changes. EYES: History of cataracts. ENT: No hearing loss. RESPIRATORY: Chronic respiratory failure and chronic obstructive pulmonary disease, on chronic oxygen. CARDIOVASCULAR: No chest pain or orthopnea. GASTROINTESTINAL: No nausea or vomiting. Grace Reyes has typically constipation, on a bowel regimen. GU: No dysuria or hematuria. ENDOCRINE: No heat or cold intolerance. HEME: Positive for bruising from her falls. SKIN: No ulcers. NEUROLOGIC: No tingling or weakness. PSYCH: See history of present illness for details.  EXAMINATION: VITAL SIGNS: Temperature is 99.1, pulse 130, respiratory rate 24, blood pressure 164/90, and saturating 100% on 3 liters. Current pulse is in the 90s. GENERAL: This is an elderly female breathing  oxygen in no acute distress. The rest of the physical examination is deferred to the primary attending.  LABS/STUDIES: Urine drug screen positive for TCA, amphetamines, MDMA, and opiate. Urinalysis with specific gravity of 1.012, pH 5,  protein 30 mg/dL, RBC less than 1 per high-power field, and WBC 1 per high-power field. Chest x-ray with discoid atelectasis versus possible infiltrate, in the region of the lingula. CT of head without contrast shows involutional changes without evidence of acute abnormalities. WBC 9.9, hemoglobin 10.9, hematocrit 32.6, platelet 207, MCV 94. Glucose 113, BUN 10, creatinine 0.92, sodium 140, potassium 4.5, chloride 102, carbon dioxide 25, calcium 8.9, total bilirubin 0.7, alkaline phosphatase 64, ALT 85, AST 53, total protein 6.8. CK 72, MB 1.6, and troponin less than 0.02. Alcohol level less than 3. Ammonia level less than 25. Venous CO2 45. EKG with sinus tachy of 107. No ST changes.  STATUS EXAMINATION: The patient is alert and oriented to person, place, time, and situation. Grace Reyes is pleasant, polite, and cooperative. Grace Reyes is marginally groomed wearing a hospital gown. Grace Reyes maintains good eye contact. Her speech is of normal rhythm, rate, and volume, rather soft. Mood is "fine" with full affect. Thought processing is logical and goal oriented. Thought content: Grace Reyes denies suicidal or homicidal ideation. There are no delusions or paranoia. There are no auditory or visual hallucinations. Her cognition is grossly intact. Her insight and judgment are fair.  RISK ASSESSMENT: This is a patient with a history of depression and anxiety but not suicidality. Grace Reyes is forward thinking and optimistic about the future.   I:  Major depressive disorder recurrent in remission.  Delirium due to medications/medical condition. disorder NOS.dependence. AXIS II:  Deferred. III:  Multiple medical problems as listed above.  IV:  Mental and physical illness, loss of way of life. V:  Global Assessment of Functioning 45.  Please continue Wellbutrin and trazodone for depression and sleep.  Grace Reyes is not confused at the time of my assessment. We will reassess tomorrow.  Grace Reyes denies prescription pill abuse/misuse. As Grace Reyes lives in the assisted  living facility with supervised medications it is difficult to explain.  Grace Reyes is on a high dose of Ativan, in addition to painkillers. This is less than ideal in the patient with delirium.   Electronic Signatures: Kristine LineaPucilowska, Sanjuan Sawa (MD)  (Signed on 15-Feb-13 22:41)  Authored  Last Updated: 15-Feb-13 22:41 by Kristine LineaPucilowska, Quasean Frye (MD)

## 2014-08-31 NOTE — H&P (Signed)
PATIENT NAME:  Grace Reyes, Grace Reyes MR#:  [id number] DATE OF BIRTH:  1941-01-10  DATE OF ADMISSION:  07/05/2011  PRIMARY CARE PHYSICIAN: Dr. Rolm Gala  CHIEF COMPLAINT: Altered mental status, cough and low-grade fever.   HISTORY OF PRESENT ILLNESS: Grace Reyes is a 74 year old Caucasian female with past medical history of chronic obstructive pulmonary disease on chronic respiratory failure. She is under care of hospice and she has a CODE STATUS of DO NOT RESUSCITATE. The patient was just discharged from the hospital on 02/19 when she was admitted with altered mental status apparently secondary to benzodiazepines and narcotics. This time the patient was brought again with altered mental status, lethargic, also reported that she was having cough with sputum production and low grade fever along with increased shortness of breath. The patient was placed on BiPAP treatment here.   REVIEW OF SYSTEMS: The patient is sedated and does not give any meaningful history. Therefore 10 point system review was unobtainable.   PAST MEDICAL HISTORY:  1. Recent admission on 02/16, discharged on 02/19 after treatment of altered mental status secondary to her medications.  2. History of chronic obstructive pulmonary disease.  3. History of chronic respiratory failure.  4. History of systemic hypertension. 5. Hyperlipidemia. 6. Coronary artery disease.  7. History of diastolic dysfunction.  8. History of chronic back pain. 9. Ex-chronic smoker.  10. History of depression.  11. History of gastroesophageal reflux disease.  12. History of chronic sinus tachycardia.  13. 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  14. Chronic sinusitis.  15. History of anemia of chronic disease. 16. History of anxiety.   PAST SURGICAL HISTORY:  1. History of appendectomy.  2. Hysterectomy.  3. Sinus surgery. 4. History of cataract surgery bilaterally.   ADMISSION MEDICATIONS: Her list of medications at discharge  from this hospital included:  1. Oxygen 2 liters nasal cannula. 2. Spiriva 1 inhalation once a day. 3. Wellbutrin 300 mg once a day. 4. Trazodone 450 mg at bedtime. 5. Aspirin 325 mg a day. 6. Triamcinolone two sprays each nostril once a day. 7. Lipitor 10 mg at bedtime. 8. Symbicort she is on dose of 160/4.5, 2 puffs twice a day. 9. Cardizem CD 300 mg once a day.  10. MS Contin 50 mg twice a day.  11. Flexeril 10 mg 3 times a day. 12. DuoNebs q.6 hours p.r.n.  13. Nexium 40 mg a day.  14. She is also on Vigamox eyedrops for both eyes 0.5 mg 3 times a day.   ALLERGIES: IV dye.   FAMILY HISTORY: Not obtainable from the patient, but according to medical records there is family history of stroke and emphysema and also myocardial infarction. There is strong family history of tobacco abuse.   SOCIAL HISTORY: Again from the medical records, she lives at [facility] assisted living facility. Patient is under care of hospice.   SOCIAL HABITS: She is ex-chronic smoker, used to smoke 2 packs a day from age of 60 and she quit in 2008. No history of alcohol or drug abuse.   PHYSICAL EXAMINATION:  VITAL SIGNS: Blood pressure 143/82, respiratory rate 24, pulse 95, temperature 100.6, oxygen saturation 98%. Right now the patient is on BiPAP treatment. She is sleeping.   GENERAL APPEARANCE: Elderly obese lady on BiPAP.   HEAD/NECK: No pallor. No icterus. No cyanosis.   ENT: Could not assess her throat, she is on the BiPAP face mask.   NECK: Supple. Trachea at midline. No masses.  HEART: Could not auscultate any heart sound or murmur, distant heart sounds barely hear anything, on top of that the noise from the BiPAP machine. No carotid bruits.   RESPIRATORY: Decreased breathing sounds. No rales. No wheezing Patient is not using accessory muscles.   ABDOMEN: Morbidly obese, soft without tenderness. No hepatosplenomegaly. No masses. No hernias.   MUSCULOSKELETAL: No joint swelling. No  clubbing.   SKIN: No ulcers. No subcutaneous nodules, however, there are scattered ecchymotic skin lesions on both upper and lower extremities.   NEUROLOGIC: Patient is sleeping, sedated on BiPAP machine but arousable then soon goes back to sleep. She is moving her upper and lower extremities. There is no facial asymmetry. Eye examination revealed that the pupils are irregular, sluggishly reactive to light.   LABORATORY, DIAGNOSTIC AND RADIOLOGICAL DATA: Chest x-ray showed no consolidation, no effusion. Serum glucose 83, BNP 327, BUN 11, creatinine 0.7, sodium 139, potassium 3.6. Troponin less than 0.02. CBC showed white count 8000, hemoglobin 10, hematocrit 31. Urinalysis was unremarkable. ABG showed pH 7.45, pCO2 35, pO2 110. This is on FiO2 of 32%.   ASSESSMENT:  1. Altered mental status. 2. Cough with sputum production and increased shortness of breath, most consistent with chronic obstructive pulmonary disease exacerbation.  3. Chronic respiratory failure. 4. DO NOT RESUSCITATE status.  5. History of multitude of medical problems includes coronary artery disease, diastolic dysfunction, hypertension, hyperlipidemia, history of MRSA infection of the eye and chronic sinusitis, ex-chronic smoker and history of anemia of chronic disease and past surgical history of appendectomy, hysterectomy and cataract surgery. History of chronic back pain on chronic narcotic use. Marland Kitchen.   PLAN: Admit to the telemetry unit. Intensify treatment of her chronic obstructive pulmonary disease exacerbation with DuoNebs along with IV Solu-Medrol and IV antibiotics. She was started on Zosyn and vancomycin since she has prior history of MRSA and also the fact that she was just in the hospital this month. Therefore coverage for MRSA and gram-negative is essential. Monitor her mental status to see if there is any improvement while she is on the BiPAP treatment. Continue home medications as listed above. Neuro checks q.2 hours x4  then q.4 hours x24 hours. Patient is hard access for IV line therefore we ordered central line to be placed.   TIME SPENT ON THIS PATIENT: Took more than 50 minutes.  ____________________________ Carney CornersAmir M. Rudene Rearwish, MD amd:cms D: 07/05/2011 04:32:19 ET T: 07/05/2011 09:07:26 ET JOB#: [id number]  cc: Carney CornersAmir M. Rudene Rearwish, MD, <Dictator> Letitia CaulHeidi M. Grandis, MD Karolee OhsAMIR Dala DockM Gergory Biello MD ELECTRONICALLY SIGNED 07/05/2011 22:31
